# Patient Record
Sex: Male | Born: 1950 | Race: White | Hispanic: No | Marital: Married | Smoking: Former smoker
Health system: Southern US, Community
[De-identification: ages and names within clinical notes are randomized; demographics above are authoritative.]

## PROBLEM LIST (undated history)

## (undated) DIAGNOSIS — G4730 Sleep apnea, unspecified: Secondary | ICD-10-CM

## (undated) DIAGNOSIS — I739 Peripheral vascular disease, unspecified: Secondary | ICD-10-CM

## (undated) DIAGNOSIS — G4700 Insomnia, unspecified: Secondary | ICD-10-CM

## (undated) DIAGNOSIS — I2510 Atherosclerotic heart disease of native coronary artery without angina pectoris: Secondary | ICD-10-CM

## (undated) DIAGNOSIS — I209 Angina pectoris, unspecified: Secondary | ICD-10-CM

## (undated) DIAGNOSIS — N529 Male erectile dysfunction, unspecified: Secondary | ICD-10-CM

## (undated) DIAGNOSIS — I10 Essential (primary) hypertension: Secondary | ICD-10-CM

## (undated) DIAGNOSIS — M545 Low back pain, unspecified: Secondary | ICD-10-CM

## (undated) DIAGNOSIS — D126 Benign neoplasm of colon, unspecified: Secondary | ICD-10-CM

## (undated) DIAGNOSIS — L409 Psoriasis, unspecified: Secondary | ICD-10-CM

## (undated) DIAGNOSIS — F419 Anxiety disorder, unspecified: Secondary | ICD-10-CM

## (undated) DIAGNOSIS — R0602 Shortness of breath: Secondary | ICD-10-CM

## (undated) DIAGNOSIS — C61 Malignant neoplasm of prostate: Secondary | ICD-10-CM

## (undated) DIAGNOSIS — E785 Hyperlipidemia, unspecified: Secondary | ICD-10-CM

## (undated) HISTORY — DX: Shortness of breath: R06.02

## (undated) HISTORY — PX: PROSTATE BIOPSY: SHX241

## (undated) HISTORY — DX: Anxiety disorder, unspecified: F41.9

## (undated) HISTORY — DX: Benign neoplasm of colon, unspecified: D12.6

## (undated) HISTORY — DX: Low back pain, unspecified: M54.50

## (undated) HISTORY — DX: Hyperlipidemia, unspecified: E78.5

## (undated) HISTORY — DX: Peripheral vascular disease, unspecified: I73.9

## (undated) HISTORY — PX: TONSILLECTOMY: SUR1361

## (undated) HISTORY — DX: Psoriasis, unspecified: L40.9

## (undated) HISTORY — DX: Low back pain: M54.5

## (undated) HISTORY — DX: Insomnia, unspecified: G47.00

## (undated) HISTORY — DX: Male erectile dysfunction, unspecified: N52.9

---

## 2009-01-06 ENCOUNTER — Encounter (INDEPENDENT_AMBULATORY_CARE_PROVIDER_SITE_OTHER): Payer: Self-pay | Admitting: Neurology

## 2009-01-06 ENCOUNTER — Ambulatory Visit: Payer: Self-pay

## 2010-08-13 ENCOUNTER — Encounter: Admission: RE | Admit: 2010-08-13 | Discharge: 2010-08-13 | Payer: Self-pay | Admitting: Family Medicine

## 2010-08-19 ENCOUNTER — Encounter
Admission: RE | Admit: 2010-08-19 | Discharge: 2010-08-19 | Payer: Self-pay | Source: Home / Self Care | Attending: Family Medicine | Admitting: Family Medicine

## 2011-03-19 ENCOUNTER — Other Ambulatory Visit: Payer: Self-pay | Admitting: Family Medicine

## 2011-03-19 DIAGNOSIS — I739 Peripheral vascular disease, unspecified: Secondary | ICD-10-CM

## 2011-03-19 DIAGNOSIS — M79606 Pain in leg, unspecified: Secondary | ICD-10-CM

## 2011-03-24 ENCOUNTER — Ambulatory Visit
Admission: RE | Admit: 2011-03-24 | Discharge: 2011-03-24 | Disposition: A | Discharge status: Home / Self Care | Payer: BC Managed Care – PPO | Source: Ambulatory Visit | Attending: Family Medicine | Admitting: Family Medicine

## 2011-03-24 ENCOUNTER — Other Ambulatory Visit: Payer: Self-pay

## 2011-03-24 DIAGNOSIS — M79606 Pain in leg, unspecified: Secondary | ICD-10-CM

## 2011-03-24 DIAGNOSIS — I739 Peripheral vascular disease, unspecified: Secondary | ICD-10-CM

## 2011-03-25 ENCOUNTER — Other Ambulatory Visit (HOSPITAL_COMMUNITY): Payer: Self-pay | Admitting: Family Medicine

## 2011-03-25 DIAGNOSIS — I739 Peripheral vascular disease, unspecified: Secondary | ICD-10-CM

## 2011-03-30 ENCOUNTER — Other Ambulatory Visit (HOSPITAL_COMMUNITY): Payer: Self-pay | Admitting: Family Medicine

## 2011-03-30 ENCOUNTER — Encounter (HOSPITAL_COMMUNITY): Payer: Self-pay

## 2011-03-30 ENCOUNTER — Ambulatory Visit (HOSPITAL_COMMUNITY)
Admission: RE | Admit: 2011-03-30 | Discharge: 2011-03-30 | Disposition: A | Discharge status: Home / Self Care | Payer: BC Managed Care – PPO | Source: Ambulatory Visit | Attending: Family Medicine | Admitting: Family Medicine

## 2011-03-30 DIAGNOSIS — I739 Peripheral vascular disease, unspecified: Secondary | ICD-10-CM

## 2011-03-30 DIAGNOSIS — I743 Embolism and thrombosis of arteries of the lower extremities: Secondary | ICD-10-CM | POA: Insufficient documentation

## 2011-03-30 DIAGNOSIS — I708 Atherosclerosis of other arteries: Secondary | ICD-10-CM | POA: Insufficient documentation

## 2011-03-30 HISTORY — DX: Peripheral vascular disease, unspecified: I73.9

## 2011-03-30 HISTORY — DX: Essential (primary) hypertension: I10

## 2011-03-30 MED ORDER — IOHEXOL 350 MG/ML SOLN: 100.0000 mL | Freq: Once | INTRAVENOUS | Status: AC | PRN

## 2011-04-05 ENCOUNTER — Other Ambulatory Visit (HOSPITAL_COMMUNITY): Payer: Self-pay | Admitting: Endocrinology

## 2011-04-05 DIAGNOSIS — E0590 Thyrotoxicosis, unspecified without thyrotoxic crisis or storm: Secondary | ICD-10-CM

## 2011-04-20 ENCOUNTER — Encounter: Payer: Self-pay | Admitting: Vascular Surgery

## 2011-04-22 ENCOUNTER — Ambulatory Visit (INDEPENDENT_AMBULATORY_CARE_PROVIDER_SITE_OTHER): Payer: BC Managed Care – PPO | Admitting: Vascular Surgery

## 2011-04-22 ENCOUNTER — Encounter: Payer: BC Managed Care – PPO | Admitting: Vascular Surgery

## 2011-04-22 ENCOUNTER — Encounter: Payer: Self-pay | Admitting: Vascular Surgery

## 2011-04-22 VITALS — BP 148/78 | HR 77 | Ht 66.0 in | Wt 191.0 lb

## 2011-04-22 DIAGNOSIS — I739 Peripheral vascular disease, unspecified: Secondary | ICD-10-CM

## 2011-04-22 NOTE — Progress Notes (Signed)
Subjective:     Patient ID: Paul Tyler, male   DOB: March 21, 1951, 60 y.o.   MRN: [id number]  HPI Patient is a 60 year old male who complains of bilateral lower extremity pain. He states that his right leg is the worst. He begins to develop calf pain in the right leg after walking 25-30 feet. He is limited somewhat at work by this. The pain is relieved with rest. He also has occasional pain in the left calf but this is not as bad. The pain has become slowly worse over the last 6 months. He denies rest pain or nonhealing ulcers. Primary atherosclerotic risk factors include hypertension, hyperlipidemia, tobacco abuse. He did quit smoking 4 days ago.  Chronic medical problems including his hypertension and hyperlipidemia are currently controlled and followed by Dr. Shaune Pollack.  Past Medical History  Diagnosis Date  . Hypertension   . PVD (peripheral vascular disease)   . Hyperlipidemia   . Psoriasis   . Recurrent low back pain   . Adenomatous colon polyp   . Peripheral vascular disease     right leg  . SOB (shortness of breath) on exertion   . Anxiety     History   Social History  . Marital Status: Married    Spouse Name: N/A    Number of Children: N/A  . Years of Education: N/A   Occupational History  . Not on file.   Social History Main Topics  . Smoking status: Current Some Day Smoker    Types: Cigarettes    Last Attempt to Quit: 04/18/2011  . Smokeless tobacco: Not on file   Comment: Patient is currently not smoking  . Alcohol Use: 0.0 oz/week    3-4 Cans of beer per week     3-4/day twice a week  . Drug Use: No  . Sexually Active:    Other Topics Concern  . Not on file   Social History Narrative  . No narrative on file    Family History  Problem Relation Age of Onset  . Cancer Father   . Heart disease Father   . Diabetes Father   . Heart disease Mother   . Stroke Mother     On Coumadin  . Diabetes Mother     Borderline, no meds  . Cancer Maternal  Uncle     prostate cancer      Review of Systems  Constitutional: Negative.   HENT: Negative.   Eyes: Negative.   Respiratory: Positive for shortness of breath.   Cardiovascular: Negative.   Gastrointestinal: Negative.   Genitourinary: Negative.   Musculoskeletal: Positive for myalgias.  Neurological: Negative.   Hematological: Negative.   Psychiatric/Behavioral: The patient is nervous/anxious.        Objective:   Physical Exam Blood pressure 148/78, pulse 77, height 5\' 6"  (1.676 m), weight 191 lb (86.637 kg), SpO2 97.00%.  HEEENT: Negative  Chest: Clear to auscultation bilaterally  Cardiac: Regular rate and rhythm without murmur  Abdomen: Soft nontender nondistended no masses, slightly obese  Musculoskeletal no major joint deformities  Neuro: Alert and oriented, symmetric upper extremity and lower Charmaine motor strength  Skin: No ulcers or rashes  Neck: No carotid bruit  Extremities: 2+ radial and femoral pulses bilaterally, absent pedal pulses right foot, 2+ left posterior tibial pulse  CT angiogram: CT dated 03/30/2011 at [facility] was reviewed. The images show some mild right external iliac artery stenosis as well as a right superficial femoral artery occlusion with three-vessel  runoff. In the left lower extremity there is diffuse atherosclerosis but no focal significant stenosis again with three-vessel runoff.  ABI: ABIs and segmental pressures were reviewed from [facility] dated 03/24/2011. ABI right 0.48, left 1.19     Assessment:     Patient with moderately disabling right lower extremity claudication. Possible options of treatment were discussed with the patient today. Option #1 walking program of 30 minutes daily combined with smoking cessation and Pletal 100 mg twice daily. Option #2 lower extremity arteriogram with possible angioplasty and stenting option #3 right lower extremity bypass angioplasty and stenting not possible      Plan:     Patient currently has opted for conservative management with smoking cessation walking program and Pletal twice daily. He will followup with me in 6 months time for repeat ABIs. He will followup sooner if he changes his mind and wishes to pursue an intervention. Short term disability paperwork was also filled out for the patient today.

## 2011-04-27 ENCOUNTER — Other Ambulatory Visit (HOSPITAL_COMMUNITY): Payer: BC Managed Care – PPO

## 2011-04-27 ENCOUNTER — Ambulatory Visit (HOSPITAL_COMMUNITY): Payer: BC Managed Care – PPO

## 2011-04-28 ENCOUNTER — Other Ambulatory Visit (HOSPITAL_COMMUNITY): Payer: BC Managed Care – PPO

## 2011-05-04 ENCOUNTER — Encounter: Payer: BC Managed Care – PPO | Admitting: Vascular Surgery

## 2011-05-18 ENCOUNTER — Ambulatory Visit (HOSPITAL_COMMUNITY): Payer: BC Managed Care – PPO

## 2011-05-18 ENCOUNTER — Other Ambulatory Visit (HOSPITAL_COMMUNITY): Payer: BC Managed Care – PPO

## 2011-05-19 ENCOUNTER — Other Ambulatory Visit (HOSPITAL_COMMUNITY): Payer: BC Managed Care – PPO

## 2011-10-28 ENCOUNTER — Ambulatory Visit: Payer: BC Managed Care – PPO | Admitting: Vascular Surgery

## 2011-11-29 ENCOUNTER — Other Ambulatory Visit: Payer: Self-pay | Admitting: Urology

## 2011-12-15 ENCOUNTER — Ambulatory Visit (HOSPITAL_COMMUNITY)
Admission: RE | Admit: 2011-12-15 | Discharge: 2011-12-15 | Disposition: A | Discharge status: Home / Self Care | Payer: BC Managed Care – PPO | Source: Ambulatory Visit | Attending: Urology | Admitting: Urology

## 2011-12-15 DIAGNOSIS — N281 Cyst of kidney, acquired: Secondary | ICD-10-CM | POA: Insufficient documentation

## 2011-12-15 DIAGNOSIS — K829 Disease of gallbladder, unspecified: Secondary | ICD-10-CM | POA: Insufficient documentation

## 2011-12-15 MED ORDER — GADOBENATE DIMEGLUMINE 529 MG/ML IV SOLN
20.0000 mL | Freq: Once | INTRAVENOUS | Status: AC | PRN
  Administered 2011-12-15: 20 mL via INTRAVENOUS

## 2012-04-11 ENCOUNTER — Other Ambulatory Visit: Payer: Self-pay | Admitting: Interventional Cardiology

## 2012-04-12 ENCOUNTER — Encounter (HOSPITAL_COMMUNITY): Payer: Self-pay | Admitting: Pharmacy Technician

## 2012-04-13 ENCOUNTER — Encounter (HOSPITAL_COMMUNITY): Payer: Self-pay | Admitting: General Practice

## 2012-04-13 ENCOUNTER — Encounter (HOSPITAL_COMMUNITY)
Admission: RE | Disposition: A | Discharge status: Home / Self Care | Payer: Self-pay | Source: Ambulatory Visit | Attending: Interventional Cardiology

## 2012-04-13 ENCOUNTER — Ambulatory Visit (HOSPITAL_COMMUNITY)
Admission: RE | Admit: 2012-04-13 | Discharge: 2012-04-14 | Disposition: A | Discharge status: Home / Self Care | Payer: BC Managed Care – PPO | Source: Ambulatory Visit | Attending: Interventional Cardiology | Admitting: Interventional Cardiology

## 2012-04-13 ENCOUNTER — Encounter (HOSPITAL_BASED_OUTPATIENT_CLINIC_OR_DEPARTMENT_OTHER): Admission: RE | Payer: Self-pay | Source: Ambulatory Visit

## 2012-04-13 ENCOUNTER — Inpatient Hospital Stay (HOSPITAL_BASED_OUTPATIENT_CLINIC_OR_DEPARTMENT_OTHER)
Admission: RE | Admit: 2012-04-13 | Payer: BC Managed Care – PPO | Source: Ambulatory Visit | Admitting: Interventional Cardiology

## 2012-04-13 DIAGNOSIS — I10 Essential (primary) hypertension: Secondary | ICD-10-CM | POA: Insufficient documentation

## 2012-04-13 DIAGNOSIS — I2510 Atherosclerotic heart disease of native coronary artery without angina pectoris: Secondary | ICD-10-CM | POA: Insufficient documentation

## 2012-04-13 DIAGNOSIS — I200 Unstable angina: Secondary | ICD-10-CM | POA: Insufficient documentation

## 2012-04-13 DIAGNOSIS — E785 Hyperlipidemia, unspecified: Secondary | ICD-10-CM | POA: Insufficient documentation

## 2012-04-13 HISTORY — PX: LEFT HEART CATHETERIZATION WITH CORONARY ANGIOGRAM: SHX5451

## 2012-04-13 HISTORY — DX: Atherosclerotic heart disease of native coronary artery without angina pectoris: I25.10

## 2012-04-13 HISTORY — DX: Angina pectoris, unspecified: I20.9

## 2012-04-13 HISTORY — PX: CORONARY ANGIOPLASTY WITH STENT PLACEMENT: SHX49

## 2012-04-13 LAB — POCT ACTIVATED CLOTTING TIME: Activated Clotting Time: 349 seconds

## 2012-04-13 SURGERY — LEFT HEART CATHETERIZATION WITH CORONARY ANGIOGRAM
Anesthesia: LOCAL

## 2012-04-13 SURGERY — JV LEFT HEART CATHETERIZATION WITH CORONARY ANGIOGRAM
Anesthesia: Moderate Sedation

## 2012-04-13 MED ORDER — CLOPIDOGREL BISULFATE 75 MG PO TABS
75.0000 mg | ORAL_TABLET | Freq: Every day | ORAL | Status: DC
  Administered 2012-04-14: 08:00:00 75 mg via ORAL
  Filled 2012-04-13: qty 1

## 2012-04-13 MED ORDER — VERAPAMIL HCL 2.5 MG/ML IV SOLN
INTRAVENOUS | Status: AC
  Filled 2012-04-13: qty 2

## 2012-04-13 MED ORDER — DIAZEPAM 5 MG PO TABS
5.0000 mg | ORAL_TABLET | ORAL | Status: AC
  Administered 2012-04-13: 5 mg via ORAL
  Filled 2012-04-13: qty 1

## 2012-04-13 MED ORDER — MORPHINE SULFATE 2 MG/ML IJ SOLN: 1.0000 mg | INTRAMUSCULAR | Status: DC | PRN

## 2012-04-13 MED ORDER — FENTANYL CITRATE 0.05 MG/ML IJ SOLN
INTRAMUSCULAR | Status: AC
  Filled 2012-04-13: qty 2

## 2012-04-13 MED ORDER — CLOPIDOGREL BISULFATE 300 MG PO TABS
ORAL_TABLET | ORAL | Status: AC
  Filled 2012-04-13: qty 2

## 2012-04-13 MED ORDER — MIDAZOLAM HCL 2 MG/2ML IJ SOLN
INTRAMUSCULAR | Status: AC
  Filled 2012-04-13: qty 2

## 2012-04-13 MED ORDER — ALUM & MAG HYDROXIDE-SIMETH 200-200-20 MG/5ML PO SUSP
ORAL | Status: AC
  Filled 2012-04-13: qty 30

## 2012-04-13 MED ORDER — ASPIRIN 81 MG PO TABS: 162.0000 mg | ORAL_TABLET | Freq: Every day | ORAL | Status: DC

## 2012-04-13 MED ORDER — ONDANSETRON HCL 4 MG/2ML IJ SOLN: 4.0000 mg | Freq: Four times a day (QID) | INTRAMUSCULAR | Status: DC | PRN

## 2012-04-13 MED ORDER — ACETAMINOPHEN 325 MG PO TABS: 650.0000 mg | ORAL_TABLET | ORAL | Status: DC | PRN

## 2012-04-13 MED ORDER — SODIUM CHLORIDE 0.9 % IV SOLN
0.2500 mg/kg/h | INTRAVENOUS | Status: DC
  Filled 2012-04-13: qty 250

## 2012-04-13 MED ORDER — ASPIRIN 81 MG PO CHEW
324.0000 mg | CHEWABLE_TABLET | ORAL | Status: AC
  Administered 2012-04-13: 354 mg via ORAL
  Filled 2012-04-13: qty 4

## 2012-04-13 MED ORDER — HEPARIN SODIUM (PORCINE) 1000 UNIT/ML IJ SOLN
INTRAMUSCULAR | Status: AC
  Filled 2012-04-13: qty 1

## 2012-04-13 MED ORDER — ASPIRIN EC 81 MG PO TBEC
162.0000 mg | DELAYED_RELEASE_TABLET | Freq: Every day | ORAL | Status: DC
  Administered 2012-04-14: 162 mg via ORAL
  Filled 2012-04-13 (×2): qty 2

## 2012-04-13 MED ORDER — NITROGLYCERIN 0.2 MG/ML ON CALL CATH LAB
INTRAVENOUS | Status: AC
  Filled 2012-04-13: qty 1

## 2012-04-13 MED ORDER — SODIUM CHLORIDE 0.9 % IJ SOLN: 3.0000 mL | Freq: Two times a day (BID) | INTRAMUSCULAR | Status: DC

## 2012-04-13 MED ORDER — NITROGLYCERIN 0.4 MG SL SUBL: 0.4000 mg | SUBLINGUAL_TABLET | SUBLINGUAL | Status: DC | PRN

## 2012-04-13 MED ORDER — SODIUM CHLORIDE 0.9 % IV SOLN
1.0000 mL/kg/h | INTRAVENOUS | Status: AC
  Administered 2012-04-13: 1 mL/kg/h via INTRAVENOUS

## 2012-04-13 MED ORDER — SODIUM CHLORIDE 0.9 % IV SOLN
INTRAVENOUS | Status: DC
  Administered 2012-04-13: 75 mL/h via INTRAVENOUS

## 2012-04-13 MED ORDER — ALPRAZOLAM 0.25 MG PO TABS
0.5000 mg | ORAL_TABLET | Freq: Three times a day (TID) | ORAL | Status: DC | PRN
Start: 2012-04-13 — End: 2012-04-14

## 2012-04-13 MED ORDER — HEPARIN (PORCINE) IN NACL 2-0.9 UNIT/ML-% IJ SOLN
INTRAMUSCULAR | Status: AC
  Filled 2012-04-13: qty 2000

## 2012-04-13 MED ORDER — SODIUM CHLORIDE 0.9 % IV SOLN: 250.0000 mL | INTRAVENOUS | Status: DC | PRN

## 2012-04-13 MED ORDER — SODIUM CHLORIDE 0.9 % IJ SOLN: 3.0000 mL | INTRAMUSCULAR | Status: DC | PRN

## 2012-04-13 MED ORDER — ALUM & MAG HYDROXIDE-SIMETH 200-200-20 MG/5ML PO SUSP
30.0000 mL | ORAL | Status: DC | PRN
  Administered 2012-04-13: 30 mL via ORAL
  Filled 2012-04-13: qty 30

## 2012-04-13 MED ORDER — LIDOCAINE HCL (PF) 1 % IJ SOLN
INTRAMUSCULAR | Status: AC
  Filled 2012-04-13: qty 30

## 2012-04-13 MED ORDER — BIVALIRUDIN 250 MG IV SOLR
INTRAVENOUS | Status: AC
  Filled 2012-04-13: qty 250

## 2012-04-13 NOTE — Progress Notes (Signed)
TR BAND REMOVAL  LOCATION:  right radial  DEFLATED PER PROTOCOL:  yes  TIME BAND OFF / DRESSING APPLIED:   1430   SITE UPON ARRIVAL:   Level 0  SITE AFTER BAND REMOVAL:  Level 0  REVERSE ALLEN'S TEST:    positive  CIRCULATION SENSATION AND MOVEMENT:  Within Normal Limits  yes  COMMENTS:     

## 2012-04-13 NOTE — H&P (Signed)
  Date of Initial H&P: 04/11/12  History reviewed, patient examined, no change in status, stable for surgery.  Questions answered.  Discussed with patient and wife before procedure.

## 2012-04-13 NOTE — CV Procedure (Addendum)
PROCEDURE:  Left heart catheterization with selective coronary angiography, left ventriculogram.  PCI LAD  INDICATIONS:  Accelerating angina.  Class III Angina  The risks, benefits, and details of the procedure were explained to the patient.  The patient verbalized understanding and wanted to proceed.  Informed written consent was obtained.  PROCEDURE TECHNIQUE:  After Xylocaine anesthesia a 51F sheath was placed in the right radial artery with a single anterior needle wall stick.   Left coronary angiography was done using a Judkins L4 guide catheter.  Right coronary angiography was done using a Judkins R4 guide catheter.  Left ventriculography was done using a pigtail catheter.  IC NTG to the RCA during the diagnostic.   CONTRAST:  Total of 155 cc.  COMPLICATIONS:  None.    HEMODYNAMICS:  Aortic pressure was 125/66; LV pressure was 125/7; LVEDP 10.  There was no gradient between the left ventricle and aorta.    ANGIOGRAPHIC DATA:   The left main coronary artery is a short vessel but widely patent.  The left anterior descending artery is a large vessel.  In the mid portion, there is a 95% stenosis before a large first diagonal.  Just past this focal stenosis, there is moderate disease.  There is an area of spontaneous intimal disruption.  There is mild diffuse disease in the remainder of the LAD.  The left circumflex artery is a large vessel.  There is a large OM1.  There are mild irregularities in the circumflex system.  The right coronary artery is a medium sized vessel.  There is moderate diffuse disease in the entire vessel.  There is a 50-60% stenosis at the bend in the distal vessel.  LEFT VENTRICULOGRAM:  Left ventricular angiogram was done in the 30 RAO projection and revealed normal left ventricular wall motion and systolic function with an estimated ejection fraction of 55%.  LVEDP was 10 mmHg.  PCI Narrative:  Angiomax was used for anticoagulation.  ACT was performed.  A CLS 3.0  Guide catheter was used to engage the left main.  A prowater wire was used to cross the area of disease in the LAD.  A 2.5 x 12 predilatation balloon was inflated twice.  There was moderate disease past the focal area of severe stenosis.  There is an irregularity of the may have been the beginnings of a spontaneous dissection even further down.  We elected to try and cover this entire diseased segment with a single drug-eluting stent.  A 2.75 x 32 Promus drug-eluting stent was deployed to 16 atmospheres.  The proximal and mid stented portion was postdilated with a 3.5 x 20 noncompliant balloon.  There is an excellent graft result.  There was a diagonal that was jailed but there is no significant stenosis at the ostium and flow was normal.  The patient tolerated the procedure well.  Several doses of intracoronary nitroglycerin were administered to help treat vasospasm at the distal edge of the stented area.  IMPRESSIONS:  1. Normal left main coronary artery. 2. 95% mid  left anterior descending artery stenosis with moderate disease just beyond this area.  This was successfully treated with a drug eluting stent. 3. Mild disease in the  left circumflex artery and its branches. 4. Moderate diffuse disease in a medium sized right coronary artery. 5. Normal left ventricular systolic function.  LVEDP 10 mmHg.  Ejection fraction 55%.  RECOMMENDATION:  Aspirin and plavix for at least a year.  Aggressive secondary prevention.  The patient we watched  overnight.  If there no complications, anticipate discharge tomorrow.  We have spoken about weight loss and how to better manage his LDL.  He has been intolerant of several statins.

## 2012-04-14 LAB — BASIC METABOLIC PANEL
BUN: 17 mg/dL (ref 6–23)
GFR calc Af Amer: >90 mL/min (ref >90)
GFR calc non Af Amer: >90 mL/min (ref >90)
Potassium: 4.1 mEq/L (ref 3.5–5.1)
Sodium: 140 mEq/L (ref 135–145)

## 2012-04-14 LAB — CBC
HCT: 40 % (ref 39.0–52.0)
MCHC: 34.3 g/dL (ref 30.0–36.0)
Platelets: 185 10*3/uL (ref 150–400)
RDW: 12.5 % (ref 11.5–15.5)
WBC: 8.6 10*3/uL (ref 4.0–10.5)

## 2012-04-14 MED ORDER — CLOPIDOGREL BISULFATE 75 MG PO TABS: 75.0000 mg | ORAL_TABLET | Freq: Every day | ORAL | Status: AC

## 2012-04-14 MED ORDER — ASPIRIN 81 MG PO TABS: 162.0000 mg | ORAL_TABLET | Freq: Every day | ORAL | Status: DC

## 2012-04-14 MED ORDER — NON FORMULARY: 10.0000 mg | Status: DC

## 2012-04-14 MED ORDER — ROSUVASTATIN CALCIUM 10 MG PO TABS
10.0000 mg | ORAL_TABLET | ORAL | Status: DC
  Filled 2012-04-14: qty 1

## 2012-04-14 MED ORDER — LISINOPRIL 10 MG PO TABS: 10.0000 mg | ORAL_TABLET | Freq: Every day | ORAL | Status: DC

## 2012-04-14 MED ORDER — ROSUVASTATIN CALCIUM 10 MG PO TABS: 10.0000 mg | ORAL_TABLET | ORAL | Status: DC

## 2012-04-14 MED ORDER — LISINOPRIL 10 MG PO TABS
10.0000 mg | ORAL_TABLET | Freq: Every day | ORAL | Status: DC
  Administered 2012-04-14: 10 mg via ORAL
  Filled 2012-04-14: qty 1

## 2012-04-14 MED FILL — Dextrose Inj 5%: INTRAVENOUS | Qty: 50 | Status: AC

## 2012-04-14 NOTE — Discharge Summary (Signed)
Patient ID: Paul Tyler MRN: [id number] DOB/AGE: 1951/08/24 61 y.o.  Admit date: 04/13/2012 Discharge date: 04/14/2012  Primary Discharge Diagnosis Unstable angina Secondary Discharge Diagnosis HTN, hyperlipidemia, CAD  Significant Diagnostic Studies: angiography: cardiac cath- drug eluting stent to the LAD. 2.75 x 32 Promus.  Consults: None  Hospital Course: 61 y/o who was seen in the office for class 3 angina, which was accelerating.  He underwent cath showing a 95% LAD lesion, with more moderate disease distally.  This was treated with a 2.75 x 32 mm Promus.  He tolerated the procedure well.  No bleeding from the radial site.  No angina with walking.  He is willing to try Crestor 10 mg weekly.  We will give him samples from our office.  We'll also consider checking vitamin D level if this is not ordered been done.  Perhaps vitamin D supplementation may make tolerating a statin easier.  Could also consider using coenzyme Q 10.  His blood pressure was increased during his hospital stay.  He has checked his blood pressure at home and typically it runs from 138 -150 systolic.  This is above target for him.  We'll start lisinopril 10 mg daily.  Will check potassium and kidney function within a week.  He was notified that he could develop a cough from this medicine.  He'll let us know if he has any problems.  He does not want to stay on medications if he does not have to.  We had a lengthy discussion about this.  If he loses weight and his blood pressure comes down, we could certainly cutback on his blood pressure medication.  He'll need to also increase his exercise.  We spoke about cardiac rehabilitation.  He says his work schedule would not allow this.  He is willing to try to exercise more at a gym where he is a member.   Discharge Exam: Blood pressure 163/79, pulse 57, temperature 98 F (36.7 C), temperature source Oral, resp. rate 12, height 5\' 6"  (1.676 m), weight 91.1 kg (200 lb 13.4 oz),  SpO2 97.00%.  [place]/AT RRR, S1-S2 No wheezing Obese 3+ right radial pulse, no hematoma No lower extremity edema Labs:   Lab Results  Component Value Date   WBC 8.6 04/14/2012   HGB 13.7 04/14/2012   HCT 40.0 04/14/2012   MCV 88.3 04/14/2012   PLT 185 04/14/2012    Lab 04/14/12 0450  NA 140  K 4.1  CL 104  CO2 28  BUN 17  CREATININE 0.72  CALCIUM 8.6  PROT --  BILITOT --  ALKPHOS --  ALT --  AST --  GLUCOSE 115*   No results found for this basename: CKTOTAL, CKMB, CKMBINDEX, TROPONINI    No results found for this basename: CHOL   No results found for this basename: HDL   No results found for this basename: LDLCALC   No results found for this basename: TRIG   No results found for this basename: CHOLHDL   No results found for this basename: LDLDIRECT      Radiology: None EKG: Normal sinus rhythm, nonspecific T-wave changes in the anterior precordium.  FOLLOW UP PLANS AND APPOINTMENTS The patient can return to work on Monday, 04/17/2012.  Medication List  As of 04/14/2012 10:20 AM   TAKE these medications         ALPRAZolam 1 MG tablet   Commonly known as: XANAX   Take 0.5-1 mg by mouth 3 (three) times daily as needed. For anxiety  aspirin 81 MG tablet   Take 2 tablets (162 mg total) by mouth daily.      clopidogrel 75 MG tablet   Commonly known as: PLAVIX   Take 1 tablet (75 mg total) by mouth daily with breakfast.      lisinopril 10 MG tablet   Commonly known as: PRINIVIL,ZESTRIL   Take 1 tablet (10 mg total) by mouth daily.      nitroGLYCERIN 0.4 MG SL tablet   Commonly known as: NITROSTAT   Place 0.4 mg under the tongue every 5 (five) minutes as needed. For chest pain      sildenafil 50 MG tablet   Commonly known as: VIAGRA   Take 50 mg by mouth daily as needed. For erectile dysfunction           Follow-up Information    Follow up with Paul Tyler., MD on 05/02/2012. (4:00 PM)    Contact information:   [address] [place] [address] [phone]       Follow up with Paul Tyler., MD on 04/20/2012. (lab work-anytime-nonfasting)    Contact information:   [address] [place] [address] [phone]          BRING ALL MEDICATIONS WITH YOU TO FOLLOW UP APPOINTMENTS  Time spent with patient to include physician time: 35 minutes going over the patient's questions regarding all of his new medications and long term plan for risk factor modification. SignedLance Muss S. 04/14/2012, 10:20 AM

## 2012-04-14 NOTE — Progress Notes (Signed)
CARDIAC REHAB PHASE I   PRE:  Rate/Rhythm: 60SR  BP:  Supine:   Sitting: 137/72  Standing:    SaO2:   MODE:  Ambulation: 700 ft   POST:  Rate/Rhythem: 69SR  BP:  Supine:   Sitting: 163/79  Standing:    SaO2:  0800-0900 Pt walked 700 ft with steady gait. Denied chest pain. Tolerated well. Education completed. Declined CRP 2 due to work schedule. Will ex on his own. Stressed importance of taking plavix and aspirin daily. Pt states does not like to take meds but will take these.  Paul Tyler

## 2012-11-09 ENCOUNTER — Other Ambulatory Visit (HOSPITAL_COMMUNITY): Payer: Self-pay | Admitting: Urology

## 2012-11-09 DIAGNOSIS — D49519 Neoplasm of unspecified behavior of unspecified kidney: Secondary | ICD-10-CM

## 2012-12-21 ENCOUNTER — Ambulatory Visit (HOSPITAL_COMMUNITY)
Admission: RE | Admit: 2012-12-21 | Discharge: 2012-12-21 | Disposition: A | Discharge status: Home / Self Care | Payer: BC Managed Care – PPO | Source: Ambulatory Visit | Attending: Urology | Admitting: Urology

## 2012-12-21 DIAGNOSIS — K824 Cholesterolosis of gallbladder: Secondary | ICD-10-CM | POA: Insufficient documentation

## 2012-12-21 DIAGNOSIS — I700 Atherosclerosis of aorta: Secondary | ICD-10-CM | POA: Insufficient documentation

## 2012-12-21 DIAGNOSIS — D49519 Neoplasm of unspecified behavior of unspecified kidney: Secondary | ICD-10-CM

## 2012-12-21 DIAGNOSIS — N281 Cyst of kidney, acquired: Secondary | ICD-10-CM | POA: Insufficient documentation

## 2012-12-21 LAB — CREATININE, SERUM
Creatinine, Ser: 0.83 mg/dL (ref 0.50–1.35)
GFR calc non Af Amer: >90 mL/min (ref >90)

## 2012-12-21 MED ORDER — GADOBENATE DIMEGLUMINE 529 MG/ML IV SOLN
17.0000 mL | Freq: Once | INTRAVENOUS | Status: AC | PRN
  Administered 2012-12-21: 17 mL via INTRAVENOUS

## 2013-06-09 ENCOUNTER — Other Ambulatory Visit: Payer: Self-pay | Admitting: Interventional Cardiology

## 2013-06-09 DIAGNOSIS — E782 Mixed hyperlipidemia: Secondary | ICD-10-CM

## 2013-06-09 DIAGNOSIS — Z79899 Other long term (current) drug therapy: Secondary | ICD-10-CM

## 2013-06-18 ENCOUNTER — Telehealth: Payer: Self-pay | Admitting: Interventional Cardiology

## 2013-06-18 NOTE — Telephone Encounter (Signed)
New problem     Discuss cholesterol medication

## 2013-06-19 MED ORDER — ROSUVASTATIN CALCIUM 10 MG PO TABS: ORAL_TABLET | ORAL | Status: DC

## 2013-06-19 NOTE — Telephone Encounter (Signed)
Rx for crestor sent in and pt is requesting samples of crestor until he received mail order.

## 2013-06-19 NOTE — Telephone Encounter (Signed)
Samples of Crestor 20mg  #7 given and pt notified to take 1/2 tab three times a week. Pt will keep cholesterol appt for the end of the month.

## 2013-06-28 ENCOUNTER — Telehealth: Payer: Self-pay | Admitting: Cardiology

## 2013-06-28 NOTE — Telephone Encounter (Signed)
Patient has failed both simvastatin and atorvastatin in the past, and Crestor is the only thing he can tolerate.  If they need [place] to complete a prior authorization, have them fax it to me at [phone] (Attn:  Riki Rusk).  He is taking Crestor 10 mg three times per week.  Okay to give him samples for the time being.

## 2013-06-28 NOTE — Telephone Encounter (Signed)
Pts crestor is not covered by insurance. [facility] requests that we switch pt to simvastatin or atorvastatin. To Riki Rusk, please advise.  Call back # [phone]. Ref # [id number]

## 2013-06-28 NOTE — Telephone Encounter (Signed)
Called [facility] and they will fax over PA form.

## 2013-06-29 NOTE — Telephone Encounter (Signed)
PA form will be signed and faxed today.

## 2013-07-02 ENCOUNTER — Telehealth: Payer: Self-pay | Admitting: Pharmacist

## 2013-07-02 NOTE — Telephone Encounter (Signed)
Patient is awaiting approval for his Crestor prior authorization. Patient requesting samples today until medication approved.  On Crestor 10 mg tiw.  Left Crestor 10 mg samples #14 up front for patient.  Patient notified.

## 2013-07-02 NOTE — Telephone Encounter (Signed)
Message copied by Lou Miner on Mon Jul 02, 2013 11:46 AM ------      Message from: Carmela Hurt      Created: Mon Jul 02, 2013  8:28 AM      Regarding: express number obtained by patient       Please call [phone]      extention [phone]      "Amor"            PLEASE CALL PATIENT ------

## 2013-07-04 ENCOUNTER — Telehealth: Payer: Self-pay | Admitting: Interventional Cardiology

## 2013-07-04 NOTE — Telephone Encounter (Signed)
Follow up     Pt canceled lab and is bringing in results from work and will pick up samples of  Crestor.

## 2013-07-05 ENCOUNTER — Other Ambulatory Visit: Payer: Self-pay | Admitting: Interventional Cardiology

## 2013-07-09 ENCOUNTER — Other Ambulatory Visit: Payer: BC Managed Care – PPO

## 2013-12-09 ENCOUNTER — Other Ambulatory Visit: Payer: Self-pay | Admitting: Interventional Cardiology

## 2014-01-29 ENCOUNTER — Ambulatory Visit (INDEPENDENT_AMBULATORY_CARE_PROVIDER_SITE_OTHER): Payer: BC Managed Care – PPO | Admitting: Interventional Cardiology

## 2014-01-29 ENCOUNTER — Encounter: Payer: Self-pay | Admitting: Interventional Cardiology

## 2014-01-29 VITALS — BP 154/71 | HR 81 | Wt 181.0 lb

## 2014-01-29 DIAGNOSIS — I10 Essential (primary) hypertension: Secondary | ICD-10-CM

## 2014-01-29 DIAGNOSIS — I2510 Atherosclerotic heart disease of native coronary artery without angina pectoris: Secondary | ICD-10-CM

## 2014-01-29 DIAGNOSIS — E782 Mixed hyperlipidemia: Secondary | ICD-10-CM

## 2014-01-29 MED ORDER — ROSUVASTATIN CALCIUM 10 MG PO TABS: ORAL_TABLET | ORAL | Status: DC

## 2014-01-29 NOTE — Progress Notes (Signed)
Patient ID: Paul Tyler, male   DOB: [birth year], 62 y.o.   MRN: [id number]    [place], [place], [place]  [address] Phone: [phone] Fax:  [phone]  Date:  01/29/2014   ID:  Paul Tyler, Paul Tyler [birth year], MRN [id number]  PCP:  Marjorie Smolder, MD      History of Present Illness: Paul Tyler is a 63 y.o. male who had an LAD stent done in 8/13. No more dizzy spells.  BP has been well controled at home (130-140/85). No CP or NTG use.  No SHOB.  Tolerating Crestor.  Does a lot of yard work.  No dedicated walking.      Wt Readings from Last 3 Encounters:  01/29/14 181 lb (82.101 kg)  04/14/12 200 lb 13.4 oz (91.1 kg)  04/14/12 200 lb 13.4 oz (91.1 kg)     Past Medical History  Diagnosis Date  . Hypertension   . PVD (peripheral vascular disease)   . Hyperlipidemia   . Psoriasis   . Recurrent low back pain   . Adenomatous colon polyp   . Peripheral vascular disease     right leg  . SOB (shortness of breath) on exertion   . Anxiety   . Coronary artery disease   . Anginal pain     Current Outpatient Prescriptions  Medication Sig Dispense Refill  . ALPRAZolam (XANAX) 1 MG tablet Take 0.5-1 mg by mouth 3 (three) times daily as needed. For anxiety      . aspirin 81 MG tablet Take 81 mg by mouth daily.      . clopidogrel (PLAVIX) 75 MG tablet TAKE 1 TABLET ONCE A DAY  90 tablet  2  . CRESTOR 10 MG tablet TAKE 1 TABLET THREE TIMES A WEEK  12 tablet  0  . lisinopril (PRINIVIL,ZESTRIL) 10 MG tablet Take 1 tablet (10 mg total) by mouth daily.  30 tablet  11  . losartan (COZAAR) 50 MG tablet TAKE 1 TABLET ONCE A DAY  90 tablet  2  . nitroGLYCERIN (NITROSTAT) 0.4 MG SL tablet Place 0.4 mg under the tongue every 5 (five) minutes as needed. For chest pain      . sildenafil (VIAGRA) 50 MG tablet Take 50 mg by mouth daily as needed. For erectile dysfunction       No current facility-administered medications for this visit.    Allergies:    Allergies   Allergen Reactions  . Lipitor [Atorvastatin Calcium]     Causes dizziness  . Zocor [Simvastatin]     Causes muscle pain  . Zyban [Bupropion Hcl]     Causes joint pain    Social History:  The patient  reports that he quit smoking about 2 years ago. His smoking use included Cigarettes. He smoked 0.00 packs per day. He has never used smokeless tobacco. He reports that he drinks alcohol. He reports that he does not use illicit drugs.   Family History:  The patient's family history includes Cancer in his father and maternal uncle; Diabetes in his father and mother; Heart disease in his father and mother; Stroke in his mother.   ROS:  Please see the history of present illness.  No nausea, vomiting.  No fevers, chills.  No focal weakness.  No dysuria.   All other systems reviewed and negative.   PHYSICAL EXAM: VS:  BP 154/71  Pulse 81  Wt 181 lb (82.101 kg) Well nourished, well developed, in no acute distress  HEENT: normal Neck: no JVD, no carotid bruits Cardiac:  normal S1, S2; RRR;  Lungs:  clear to auscultation bilaterally, no wheezing, rhonchi or rales Abd: soft, nontender, no hepatomegaly Ext: no edema Skin: warm and dry Neuro:   no focal abnormalities noted  EKG:  NSR, IRBBB, No St segment changes     ASSESSMENT AND PLAN:  Coronary atherosclerosis of native coronary artery  Continue Plavix Tablet, 75 MG, 1 tablet, Orally, Once a day, 90, Refills 3 Continue Aspirin Adult Low Strength Tablet Delayed Release, 81 MG, 1 tablet, Orally, everyday Continue Nitroglycerin 0.4 mg tablet, 0.4 mg, 1 tablet as directed, SL, as directed prn chest pain Notes: No angina since stent.    2. Mixed hyperlipidemia  Continue Crestor Tablet, 10 MG, 1 tablet, Orally, three a week Notes: LDL much better on Crestor three times weekly. LDL 91 in 7/14.    3. HTN  Started Losartan Potassium Tablet, 50 MG, 1 tablet, Orally, Once a day, 30 day(s), 90, Refills 3 Notes: Controlled at home   4.  Atherosclerosis of native arteries of the extremities with intermittent claudication  Notes: known Right SFA disease. Claudication resolved after stopping smoking. Encouraged walking.    Preventive Medicine  Adult topics discussed:  Diet: healthy diet.  Exercise: 5 days a week, at least 30 minutes of aerobic exercise. Does yard work and walks a lot at work.       Signed, Mina Marble, MD, [facility] 01/29/2014 4:23 PM

## 2014-01-29 NOTE — Patient Instructions (Signed)
Your physician recommends that you continue on your current medications as directed. Please refer to the Current Medication list given to you today.  Your physician wants you to follow-up in: 1 year with Dr. Varanasi. You will receive a reminder letter in the mail two months in advance. If you don't receive a letter, please call our office to schedule the follow-up appointment.  

## 2014-01-31 ENCOUNTER — Other Ambulatory Visit: Payer: Self-pay | Admitting: *Deleted

## 2014-01-31 MED ORDER — ROSUVASTATIN CALCIUM 10 MG PO TABS: ORAL_TABLET | ORAL | Status: DC

## 2014-02-28 ENCOUNTER — Encounter: Payer: Self-pay | Admitting: *Deleted

## 2014-03-08 ENCOUNTER — Other Ambulatory Visit: Payer: Self-pay | Admitting: Interventional Cardiology

## 2014-03-22 ENCOUNTER — Encounter: Payer: Self-pay | Admitting: Interventional Cardiology

## 2014-07-30 ENCOUNTER — Other Ambulatory Visit: Payer: Self-pay | Admitting: Interventional Cardiology

## 2014-08-22 ENCOUNTER — Encounter (HOSPITAL_COMMUNITY): Payer: Self-pay | Admitting: Interventional Cardiology

## 2015-01-21 ENCOUNTER — Other Ambulatory Visit: Payer: Self-pay | Admitting: Interventional Cardiology

## 2015-04-21 ENCOUNTER — Other Ambulatory Visit: Payer: Self-pay | Admitting: Interventional Cardiology

## 2015-04-24 ENCOUNTER — Other Ambulatory Visit: Payer: Self-pay | Admitting: Interventional Cardiology

## 2015-06-20 ENCOUNTER — Encounter: Payer: Self-pay | Admitting: Interventional Cardiology

## 2015-07-19 ENCOUNTER — Other Ambulatory Visit: Payer: Self-pay | Admitting: Interventional Cardiology

## 2015-07-25 ENCOUNTER — Other Ambulatory Visit: Payer: Self-pay | Admitting: Interventional Cardiology

## 2015-08-14 ENCOUNTER — Encounter: Payer: Self-pay | Admitting: Interventional Cardiology

## 2015-08-14 ENCOUNTER — Ambulatory Visit (INDEPENDENT_AMBULATORY_CARE_PROVIDER_SITE_OTHER): Payer: BLUE CROSS/BLUE SHIELD | Admitting: Interventional Cardiology

## 2015-08-14 VITALS — BP 120/64 | HR 59 | Ht 66.0 in | Wt 192.0 lb

## 2015-08-14 DIAGNOSIS — I10 Essential (primary) hypertension: Secondary | ICD-10-CM

## 2015-08-14 DIAGNOSIS — I739 Peripheral vascular disease, unspecified: Secondary | ICD-10-CM

## 2015-08-14 DIAGNOSIS — I2510 Atherosclerotic heart disease of native coronary artery without angina pectoris: Secondary | ICD-10-CM

## 2015-08-14 DIAGNOSIS — E782 Mixed hyperlipidemia: Secondary | ICD-10-CM

## 2015-08-14 NOTE — Progress Notes (Signed)
Patient ID: Paul Tyler, male   DOB: [birth year], 64 y.o.   MRN: [id number]     Cardiology Office Note   Date:  08/14/2015   ID:  Paul Tyler, DOB 01-Jun-1951, MRN [id number]  PCP:  Marjorie Smolder, MD    No chief complaint on file.    Wt Readings from Last 3 Encounters:  08/14/15 192 lb (87.091 kg)  01/29/14 181 lb (82.101 kg)  04/14/12 200 lb 13.4 oz (91.1 kg)       History of Present Illness: Paul Tyler is a 64 y.o. male   who had an LAD stent done in 8/13. No more dizzy spells. BP had been well controled at home (130-140/85), but he has not been checking. No CP or NTG use. No SHOB. Tolerating Crestor. Does a lot of yard work. No dedicated walking. Does walk a lot at work.  No chest pain with that activity.    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  There eis a lot of yard work; weed-eating etc...    Past Medical History  Diagnosis Date  . Hypertension   . PVD (peripheral vascular disease) ([place])   . Hyperlipidemia   . Psoriasis   . Recurrent low back pain   . Adenomatous colon polyp   . Peripheral vascular disease (HCC)     right leg  . SOB (shortness of breath) on exertion   . Anxiety   . Coronary artery disease   . Anginal pain ([place])   . Insomnia, unspecified   . Impotence of organic origin     Past Surgical History  Procedure Laterality Date  . Coronary angioplasty with stent placement  04/13/2012    MID LAD  . Left heart catheterization with coronary angiogram N/A 04/13/2012    Procedure: LEFT HEART CATHETERIZATION WITH CORONARY ANGIOGRAM;  Surgeon: Jettie Booze, MD;  Location: [facility] CATH LAB;  Service: Cardiovascular;  Laterality: N/A;     Current Outpatient Prescriptions  Medication Sig Dispense Refill  . ALPRAZolam (XANAX) 0.25 MG tablet Take 0.25 mg by mouth 2 (two) times daily as needed for anxiety.    Marland Kitchen aspirin 81 MG tablet Take 81 mg by mouth daily.    . Cholecalciferol (VITAMIN D) 2000 UNITS CAPS Take by mouth.      . clopidogrel (PLAVIX) 75 MG tablet TAKE 1 TABLET DAILY (NEEDS TO CONTACT OFFICE TO SCHEDULE APPOINTMENT FOR FUTURE REFILLS [phone]) 30 tablet 0  . losartan (COZAAR) 50 MG tablet Take 1 tablet (50 mg total) by mouth daily. 30 tablet 0  . rosuvastatin (CRESTOR) 10 MG tablet Take 1 tablet (10 mg total) by mouth 3 (three) times a week. 15 tablet 0  . nitroGLYCERIN (NITROSTAT) 0.4 MG SL tablet Place 0.4 mg under the tongue every 5 (five) minutes as needed. For chest pain     No current facility-administered medications for this visit.    Allergies:   Lipitor; Zocor; and Zyban    Social History:  The patient  reports that he quit smoking about 4 years ago. His smoking use included Cigarettes. He has never used smokeless tobacco. He reports that he drinks alcohol. He reports that he does not use illicit drugs.   Family History:  The patient's family history includes Diabetes in his father and mother; Heart disease in his father and mother; Prostate cancer in his father and maternal uncle; Stroke in his mother. There is no history of Heart attack or Hypertension.    ROS:  Please see the history of present illness.   Otherwise, review of systems are positive for easy bruising.  No bleeding.   All other systems are reviewed and negative.    PHYSICAL EXAM: VS:  BP 120/64 mmHg  Pulse 59  Ht 5\' 6"  (1.676 m)  Wt 192 lb (87.091 kg)  BMI 31.00 kg/m2 , BMI Body mass index is 31 kg/(m^2). GEN: Well nourished, well developed, in no acute distress HEENT: normal Neck: no JVD, carotid bruits, or masses Cardiac: RRR; no murmurs, rubs, or gallops,no edema  Respiratory:  clear to auscultation bilaterally, normal work of breathing GI: soft, nontender, nondistended, + BS MS: no deformity or atrophy Skin: warm and dry, no rash Neuro:  Strength and sensation are intact Psych: euthymic mood, full affect   EKG:   The ekg ordered today demonstrates SB, nonspecific ST changes   Recent Labs: No  results found for requested labs within last 365 days.   Lipid Panel No results found for: CHOL, TRIG, HDL, CHOLHDL, VLDL, LDLCALC, LDLDIRECT   Other studies Reviewed: Additional studies/ records that were reviewed today with results demonstrating: Cath results reviewed; 2.75 x 32 Promus to the LAD.     ASSESSMENT AND PLAN:  1. CAD: Stop aspirin, given easy bruising.  Continue clopidogrel.  Continue Nitroglycerin 0.4 mg tablet, 0.4 mg, 1 tablet as directed, SL, as directed prn chest pain. No angina since stent.  2. Mixed hyperlipidemia: Tolerating Crestor 3x/week. Dr. Inda Merlin checked his labs and lipids were well controlled on the most recent check with Dr. Inda Merlin. 3. HTN: COntinue losartan.  Check BP at home.  Let us know if BP over [id number] systolic consistently.  We can adjust medications.  4. PAD: known Right SFA disease. Claudication improved after stopping smoking. Encouraged walking. Very rare right leg pain, after walking a few flights of stairs.  This does not limit his activity.  No non healing sores on his feet.   Current medicines are reviewed at length with the patient today.  The patient concerns regarding his medicines were addressed.  The following changes have been made:  No change  Labs/ tests ordered today include:  No orders of the defined types were placed in this encounter.    Recommend 150 minutes/week of aerobic exercise Low fat, low carb, high fiber diet recommended  Disposition:   FU in  1 year   Teresita Madura., MD  08/14/2015 3:40 PM    [facility] [place], [place], [place]  [address] Phone: [phone]; Fax: [phone]

## 2015-08-14 NOTE — Patient Instructions (Signed)
**Note De-identified Shakeila Pfarr Obfuscation** Medication Instructions:  Same-no changes  Labwork: None  Testing/Procedures: None  Follow-Up: Your physician wants you to follow-up in: 1 year. You will receive a reminder letter in the mail two months in advance. If you don't receive a letter, please call our office to schedule the follow-up appointment.      

## 2015-09-04 ENCOUNTER — Other Ambulatory Visit: Payer: Self-pay | Admitting: *Deleted

## 2015-09-04 MED ORDER — CLOPIDOGREL BISULFATE 75 MG PO TABS: ORAL_TABLET | ORAL | Status: DC

## 2015-09-04 MED ORDER — LOSARTAN POTASSIUM 50 MG PO TABS: 50.0000 mg | ORAL_TABLET | Freq: Every day | ORAL | Status: DC

## 2015-10-22 ENCOUNTER — Other Ambulatory Visit: Payer: Self-pay | Admitting: Interventional Cardiology

## 2015-10-23 ENCOUNTER — Other Ambulatory Visit: Payer: Self-pay | Admitting: *Deleted

## 2015-10-23 MED ORDER — ROSUVASTATIN CALCIUM 10 MG PO TABS: 10.0000 mg | ORAL_TABLET | ORAL | Status: DC

## 2015-10-23 NOTE — Addendum Note (Signed)
Addended by: Domenica Reamer R on: 10/23/2015 02:25 PM   Modules accepted: Orders

## 2016-03-09 ENCOUNTER — Encounter: Payer: Self-pay | Admitting: Interventional Cardiology

## 2016-07-22 ENCOUNTER — Encounter: Payer: Self-pay | Admitting: Interventional Cardiology

## 2016-08-10 ENCOUNTER — Encounter: Payer: Self-pay | Admitting: Interventional Cardiology

## 2016-08-17 ENCOUNTER — Ambulatory Visit (INDEPENDENT_AMBULATORY_CARE_PROVIDER_SITE_OTHER): Payer: BLUE CROSS/BLUE SHIELD | Admitting: Interventional Cardiology

## 2016-08-17 ENCOUNTER — Encounter: Payer: Self-pay | Admitting: Interventional Cardiology

## 2016-08-17 VITALS — BP 130/78 | HR 55 | Ht 65.0 in | Wt 197.0 lb

## 2016-08-17 DIAGNOSIS — I739 Peripheral vascular disease, unspecified: Secondary | ICD-10-CM

## 2016-08-17 DIAGNOSIS — E782 Mixed hyperlipidemia: Secondary | ICD-10-CM

## 2016-08-17 DIAGNOSIS — I10 Essential (primary) hypertension: Secondary | ICD-10-CM | POA: Diagnosis not present

## 2016-08-17 DIAGNOSIS — I2510 Atherosclerotic heart disease of native coronary artery without angina pectoris: Secondary | ICD-10-CM

## 2016-08-17 MED ORDER — CLOPIDOGREL BISULFATE 75 MG PO TABS: ORAL_TABLET | ORAL | 3 refills | Status: DC

## 2016-08-17 MED ORDER — ROSUVASTATIN CALCIUM 10 MG PO TABS: 10.0000 mg | ORAL_TABLET | ORAL | 3 refills | Status: DC

## 2016-08-17 MED ORDER — LOSARTAN POTASSIUM 50 MG PO TABS: 50.0000 mg | ORAL_TABLET | Freq: Every day | ORAL | 3 refills | Status: DC

## 2016-08-17 NOTE — Patient Instructions (Signed)

## 2016-08-17 NOTE — Progress Notes (Signed)
Patient ID: Paul Tyler, male   DOB: [birth year], 65 y.o.   MRN: [id number]     Cardiology Office Note   Date:  08/17/2016   ID:  Paul Tyler, DOB 13-Dec-1950, MRN [id number]  PCP:  Marjorie Smolder, MD    No chief complaint on file. CAD   Wt Readings from Last 3 Encounters:  08/17/16 197 lb (89.4 kg)  08/14/15 192 lb (87.1 kg)  01/29/14 181 lb (82.1 kg)       History of Present Illness: Paul Tyler is a 65 y.o. male   who had an LAD stent done in 8/13, 2.75 x 32 Promus DES, postdilated to 3.5 mm. No more dizzy spells. BP had been well controled at home (130-140/85), but he has not been checking. No CP or NTG use. No SHOB. Tolerating Crestor. Does a lot of yard work. No dedicated walking. Does walk a lot at work.  No chest pain with that activity.    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  No regular exercise.  He does a lot of yard work.    Trying to eat healthier.   Easy bleeding from scratches.   Past Medical History:  Diagnosis Date  . Adenomatous colon polyp   . Anginal pain ([place])   . Anxiety   . Coronary artery disease   . Hyperlipidemia   . Hypertension   . Impotence of organic origin   . Insomnia, unspecified   . Peripheral vascular disease (HCC)    right leg  . Psoriasis   . PVD (peripheral vascular disease) ([place])   . Recurrent low back pain   . SOB (shortness of breath) on exertion     Past Surgical History:  Procedure Laterality Date  . CORONARY ANGIOPLASTY WITH STENT PLACEMENT  04/13/2012   MID LAD  . LEFT HEART CATHETERIZATION WITH CORONARY ANGIOGRAM N/A 04/13/2012   Procedure: LEFT HEART CATHETERIZATION WITH CORONARY ANGIOGRAM;  Surgeon: Jettie Booze, MD;  Location: [facility] CATH LAB;  Service: Cardiovascular;  Laterality: N/A;     Current Outpatient Prescriptions  Medication Sig Dispense Refill  . ALPRAZolam (XANAX) 0.25 MG tablet Take 0.25 mg by mouth 2 (two) times  daily as needed for anxiety.    Marland Kitchen aspirin 81 MG tablet Take 81 mg by mouth daily.    . Cholecalciferol (VITAMIN D) 2000 UNITS CAPS Take by mouth.    . clopidogrel (PLAVIX) 75 MG tablet TAKE 1 TABLET DAILY 14 tablet 0  . losartan (COZAAR) 50 MG tablet Take 1 tablet (50 mg total) by mouth daily. 90 tablet 3  . nitroGLYCERIN (NITROSTAT) 0.4 MG SL tablet Place 0.4 mg under the tongue every 5 (five) minutes as needed. For chest pain    . rosuvastatin (CRESTOR) 10 MG tablet Take 1 tablet (10 mg total) by mouth 3 (three) times a week. 90 tablet 3   No current facility-administered medications for this visit.     Allergies:   Lipitor [atorvastatin calcium]; Zocor [simvastatin]; and Zyban [bupropion hcl]    Social History:  The patient  reports that he quit smoking about 5 years ago. His smoking use included Cigarettes. He has never used smokeless tobacco. He reports that he drinks alcohol. He reports that he does not use drugs.   Family History:  The patient's family history includes Diabetes in his father and mother; Heart disease in his father and mother; Prostate cancer in his father  and maternal uncle; Stroke in his mother.    ROS:  Please see the history of present illness.   Otherwise, review of systems are positive for easy bruising, nuisance bleeding.  No internal bleeding.   All other systems are reviewed and negative.    PHYSICAL EXAM: VS:  BP 130/78   Pulse (!) 55   Ht 5\' 5"  (1.651 m)   Wt 197 lb (89.4 kg)   BMI 32.78 kg/m  , BMI Body mass index is 32.78 kg/m. GEN: Well nourished, well developed, in no acute distress  HEENT: normal  Neck: no JVD, carotid bruits, or masses Cardiac: RRR; no murmurs, rubs, or gallops,no edema  Respiratory:  clear to auscultation bilaterally, normal work of breathing GI: soft, nontender, nondistended, + BS MS: no deformity or atrophy  Skin: warm and dry, no rash Neuro:  Strength and sensation are intact Psych: euthymic mood, full  affect   EKG:   The ekg ordered today demonstrates SB, nonspecific ST changes   Recent Labs: No results found for requested labs within last 8760 hours.   Lipid Panel No results found for: CHOL, TRIG, HDL, CHOLHDL, VLDL, LDLCALC, LDLDIRECT   Other studies Reviewed: Additional studies/ records that were reviewed today with results demonstrating: Cath results reviewed; 2.75 x 32 Promus to the LAD.     ASSESSMENT AND PLAN:  1. CAD: Recommended Stopping aspirin last year, given easy bruising, bleeding from scratches.  Continue clopidogrel.  Continue Nitroglycerin 0.4 mg tablet, 0.4 mg, 1 tablet as directed, SL, as directed prn chest pain. No angina since stent. He is willing to stop taking aspirin.   2. Mixed hyperlipidemia: Tolerating Crestor 3x/week. Dr. Inda Merlin checked his labs and lipids were well controlled on the most recent check.  LDL 100 in 11/17.  TG were in the 400s and have come down quite a bit. 3. HTN: COntinue losartan.  Check BP at home.  Let us know if BP over [id number] systolic consistently.  We can adjust medications.  4. PAD: known Right SFA disease. Claudication improved after stopping smoking. Encouraged walking. Very rare right leg pain, after walking a few flights of stairs, or up an incline.  This does not limit his activity.  No non healing sores on his feet.  He will let us know if that changes.   Current medicines are reviewed at length with the patient today.  The patient concerns regarding his medicines were addressed.  The following changes have been made:  No change, refills given fro losartan, clopidogrel and crestor  Labs/ tests ordered today include:  No orders of the defined types were placed in this encounter.   Recommend 150 minutes/week of aerobic exercise Low fat, low carb, high fiber diet recommended  Disposition:   FU in  1 year   Signed, Larae Grooms, MD  08/17/2016 4:17 PM    [facility] [place] [place],  [place], [place]  [address] Phone: [phone]; Fax: [phone]

## 2016-08-18 ENCOUNTER — Other Ambulatory Visit: Payer: Self-pay | Admitting: *Deleted

## 2016-08-18 MED ORDER — LOSARTAN POTASSIUM 50 MG PO TABS: 50.0000 mg | ORAL_TABLET | Freq: Every day | ORAL | 3 refills | Status: DC

## 2017-01-19 ENCOUNTER — Other Ambulatory Visit: Payer: Self-pay | Admitting: Interventional Cardiology

## 2017-01-20 NOTE — Telephone Encounter (Signed)
Refills already on file. Call and have transferred over if patient wants to be filled with mail order.   rosuvastatin (CRESTOR) 10 MG tablet  Medication  Date: 08/17/2016 Department: [facility] Office Ordering/Authorizing: Jettie Booze, MD  Order Providers   Prescribing Provider Encounter Provider  Jettie Booze, MD Jettie Booze, MD  Medication Detail    Disp Refills Start End   rosuvastatin (CRESTOR) 10 MG tablet 90 tablet 3 08/18/2016    Sig - Route: Take 1 tablet (10 mg total) by mouth 3 (three) times a week. - Oral   E-Prescribing Status: Receipt confirmed by pharmacy (08/17/2016 4:57 PM EST)   Pharmacy   [id number] - [place], [place].

## 2017-01-24 ENCOUNTER — Other Ambulatory Visit: Payer: Self-pay | Admitting: *Deleted

## 2017-01-24 MED ORDER — NITROGLYCERIN 0.4 MG SL SUBL: 0.4000 mg | SUBLINGUAL_TABLET | SUBLINGUAL | 3 refills | Status: DC | PRN

## 2017-01-25 ENCOUNTER — Other Ambulatory Visit: Payer: Self-pay

## 2017-01-25 MED ORDER — ROSUVASTATIN CALCIUM 10 MG PO TABS: 10.0000 mg | ORAL_TABLET | ORAL | 3 refills | Status: DC

## 2017-05-03 ENCOUNTER — Telehealth: Payer: Self-pay | Admitting: Interventional Cardiology

## 2017-05-03 NOTE — Telephone Encounter (Signed)
Spoke with patient and he c/o for the past month, he has been having an intermittent, dull pain in his chest rated 3/10 with sob. Patient said he left work early yesterday because of fatigue and stayed out of work today. Patient said the chest pain has happened 3-4 times over the past month. Patient did not sound sob while on the phone and was not having active chest pain. Patient said he notices the chest pain more at rest. No c/o dizziness, fever, chills or n/v. Patient said that he has not had to use his nitroglycerin. Patient said he did not need a note for work today. Patient was given an appointment to see his provider on Friday, 05/06/17 @4 :00 pm and was advised that if his symptoms got worse, that he needed to go to the ED for an evaluation. Patient verbalized understanding of plan.

## 2017-05-03 NOTE — Telephone Encounter (Signed)
Can start Imdur 30 mg daily

## 2017-05-03 NOTE — Telephone Encounter (Signed)
New message       Talk to the nurse about "some symptoms" he is having.  Pt would not tell me what the symptoms are.  He want to tell the nurse

## 2017-05-04 NOTE — Telephone Encounter (Signed)
Left message for patient to call back  

## 2017-05-04 NOTE — Telephone Encounter (Addendum)
Made patient aware of Dr. Hassell Done recommendations to start imdur 30 mg QD. Patient states that he wants to wait until after his appointment on Friday with Dr. Irish Lack before starting any new medicines. He states that his discomfort is not that severe and that he preferred to wait at this time.

## 2017-05-06 ENCOUNTER — Encounter: Payer: Self-pay | Admitting: Interventional Cardiology

## 2017-05-06 ENCOUNTER — Encounter (INDEPENDENT_AMBULATORY_CARE_PROVIDER_SITE_OTHER): Payer: Self-pay

## 2017-05-06 ENCOUNTER — Ambulatory Visit (INDEPENDENT_AMBULATORY_CARE_PROVIDER_SITE_OTHER): Payer: BLUE CROSS/BLUE SHIELD | Admitting: Interventional Cardiology

## 2017-05-06 VITALS — BP 140/66 | HR 71 | Ht 66.0 in | Wt 196.2 lb

## 2017-05-06 DIAGNOSIS — I10 Essential (primary) hypertension: Secondary | ICD-10-CM | POA: Diagnosis not present

## 2017-05-06 DIAGNOSIS — I25118 Atherosclerotic heart disease of native coronary artery with other forms of angina pectoris: Secondary | ICD-10-CM

## 2017-05-06 DIAGNOSIS — E782 Mixed hyperlipidemia: Secondary | ICD-10-CM

## 2017-05-06 MED ORDER — ROSUVASTATIN CALCIUM 10 MG PO TABS: 10.0000 mg | ORAL_TABLET | ORAL | 3 refills | Status: DC

## 2017-05-06 NOTE — Progress Notes (Signed)
Cardiology Office Note   Date:  05/06/2017   ID:  Paul, Tyler [birth year], MRN [id number]  PCP:  Darcus Austin, MD    Chief Complaint  Patient presents with  . Follow-up  CAD   Wt Readings from Last 3 Encounters:  05/06/17 196 lb 3.2 oz (89 kg)  08/17/16 197 lb (89.4 kg)  08/14/15 192 lb (87.1 kg)       History of Present Illness: Paul Tyler is a 66 y.o. male   who had an LAD stent done in 8/13, 2.75 x 32 Promus DES, postdilated to 3.5 mm.   He has been maintained on medical therapy since that time.    Of late, he has noted some pressure in his chest that occurs at rest, usually in the morning.  It does not limit his activity.  It lasts a few minutes and resolves on its own.  He has not had I with activity.  At times, he ash some SHOB with exertion, but not all of the time.  Sx are not like what he had before the stent.    Before his stent, he had a chest pain with a distinctive sharpness, that would come on with exertion and resolve with rest.  He continues to be active and exceed what he was doing in 2013, without anginal sx.  In 2013, he had to stop activity due to pain.  Now, he continues to exert without a problem.    Denies :  Dizziness. Leg edema. Nitroglycerin use. Orthopnea. Palpitations. Paroxysmal nocturnal dyspnea. Syncope.      Past Medical History:  Diagnosis Date  . Adenomatous colon polyp   . Anginal pain ([place])   . Anxiety   . Coronary artery disease   . Hyperlipidemia   . Hypertension   . Impotence of organic origin   . Insomnia, unspecified   . Peripheral vascular disease (HCC)    right leg  . Psoriasis   . PVD (peripheral vascular disease) ([place])   . Recurrent low back pain   . SOB (shortness of breath) on exertion     Past Surgical History:  Procedure Laterality Date  . CORONARY ANGIOPLASTY WITH STENT PLACEMENT  04/13/2012   MID LAD  . LEFT HEART CATHETERIZATION WITH CORONARY ANGIOGRAM N/A 04/13/2012   Procedure: LEFT  HEART CATHETERIZATION WITH CORONARY ANGIOGRAM;  Surgeon: Paul Booze, MD;  Location: [facility] CATH LAB;  Service: Cardiovascular;  Laterality: N/A;     Current Outpatient Prescriptions  Medication Sig Dispense Refill  . clopidogrel (PLAVIX) 75 MG tablet Take 75 mg by mouth daily.    Marland Kitchen ALPRAZolam (XANAX) 0.25 MG tablet Take 0.25 mg by mouth 2 (two) times daily as needed for anxiety.    Marland Kitchen aspirin 81 MG tablet Take 81 mg by mouth daily.    . Cholecalciferol (VITAMIN D) 2000 UNITS CAPS Take 1 capsule by mouth daily.     Marland Kitchen losartan (COZAAR) 50 MG tablet Take 1 tablet (50 mg total) by mouth daily. 90 tablet 3  . nitroGLYCERIN (NITROSTAT) 0.4 MG SL tablet Place 1 tablet (0.4 mg total) under the tongue every 5 (five) minutes as needed. For chest pain 25 tablet 3  . rosuvastatin (CRESTOR) 10 MG tablet Take 1 tablet (10 mg total) by mouth 3 (three) times a week. 45 tablet 3   No current facility-administered medications for this visit.     Allergies:   Lipitor [atorvastatin calcium]; Zocor [simvastatin]; and Zyban [bupropion hcl]  Social History:  The patient  reports that he quit smoking about 6 years ago. His smoking use included Cigarettes. He has never used smokeless tobacco. He reports that he drinks alcohol. He reports that he does not use drugs.   Family History:  The patient's family history includes Diabetes in his father and mother; Heart disease in his father and mother; Prostate cancer in his father and maternal uncle; Stroke in his mother.    ROS:  Please see the history of present illness.   Otherwise, review of systems are positive for chest pressure as nboted above.   All other systems are reviewed and negative.    PHYSICAL EXAM: VS:  BP 140/66   Pulse 71   Ht 5\' 6"  (1.676 m)   Wt 196 lb 3.2 oz (89 kg)   BMI 31.67 kg/m  , BMI Body mass index is 31.67 kg/m. GEN: Well nourished, well developed, in no acute distress  HEENT: normal  Neck: no JVD, carotid bruits, or  masses Cardiac: RRR; no murmurs, rubs, or gallops,no edema ; no pain to palpitation Respiratory:  clear to auscultation bilaterally, normal work of breathing GI: soft, nontender, nondistended, + BS MS: no deformity or atrophy  Skin: warm and dry, no rash Neuro:  Strength and sensation are intact Psych: euthymic mood, full affect   EKG:   The ekg ordered today demonstrates NSR, nonspecific ST changes; no significant change compared to prior   Recent Labs: No results found for requested labs within last 8760 hours.   Lipid Panel No results found for: CHOL, TRIG, HDL, CHOLHDL, VLDL, LDLCALC, LDLDIRECT   Other studies Reviewed: Additional studies/ records that were reviewed today with results demonstrating: cath results reviewed- noted above; lipids reviewed.   ASSESSMENT AND PLAN:  1. CAD:  Unclear sx.  CP, but  Not his typical angina like before, but something new compared to being asymptomatic. I recommended a treadmilll test.  He would like to hold off.  He will let us know if sx become more typical or if activity is affected.  We also discussed cath. 2. Hyperlipidemia: LDL 100 on Crestor 3x/week.  Lipids to be rechecked in November 2018. 3. Hypertensive heart disease: Blood pressure fairly well controlled today.  Better at home when he last checked.   Current medicines are reviewed at length with the patient today.  The patient concerns regarding his medicines were addressed.  The following changes have been made:  No change  Labs/ tests ordered today include:  No orders of the defined types were placed in this encounter.   Recommend 150 minutes/week of aerobic exercise Low fat, low carb, high fiber diet recommended  Disposition:   FU in 6 months   Signed, Larae Grooms, MD  05/06/2017 4:17 PM    [facility] [place], [place], [place]  [address] Phone: [phone]; Fax: [phone]

## 2017-05-06 NOTE — Patient Instructions (Signed)

## 2017-08-15 ENCOUNTER — Other Ambulatory Visit: Payer: Self-pay | Admitting: Interventional Cardiology

## 2017-09-21 ENCOUNTER — Telehealth: Payer: Self-pay

## 2017-09-21 NOTE — Telephone Encounter (Signed)
   [facility] Pre-operative Risk Assessment    Request for surgical clearance:  1. What type of surgery is being performed? Prostate Biopsy and ultrasound   2. When is this surgery scheduled? TBD   3. Are there any medications that need to be held prior to surgery and how long? Plavix for 7 days   4. Practice name and name of physician performing surgery?  1. [facility] 2. Dr. Jeffie Pollock   5. What is your office phone and fax number?  1. Phone: [phone] 2. Fax [phone]   6. Anesthesia type (None, local, MAC, general) ? None specified    _________________________________________________________________   (provider comments below)

## 2017-09-22 NOTE — Telephone Encounter (Signed)
Called the patient and left message for him to call back. During last visit, Dr. Irish Lack recommended stress test, but patient wished to hold off. I could not reach the patient to talk to him about any new symptom. Left message. During the mean time, will forward to Dr. Irish Lack to see if he would be ok with patient come off plavix for 7 days for his prostate [place] and biopsy assume his chest pain resolved. Last PCI 04/13/2012 with 2.75 x 32 promus DES to mid LAD.   Hilbert Corrigan PA Pager: [phone]

## 2017-09-23 NOTE — Telephone Encounter (Signed)
If he is symptom free with activity, he can come off of Plavix 7 days prior.

## 2017-09-26 NOTE — Telephone Encounter (Signed)
   Primary Cardiologist: Dr. Irish Lack  Chart reviewed as part of pre-operative protocol coverage. Given past medical history and time since last visit, based on ACC/AHA guidelines, Paul Tyler would be at acceptable risk for the planned procedure without further cardiovascular testing.  Pt has had no further discomfort that was mentioned in OV in August.  He can do 4 METS without anginal symptoms.  Dr. Irish Lack has reviewed and pt may stop plavix 7 days prior to procedure and resume when Dr. Jeffie Pollock determines It is safe to do so.  Pt is aware.   I will route this recommendation to the requesting party via Epic fax function and remove from pre-op pool.  Please call with questions.  Cecilie Kicks, NP 09/26/2017, 1:54 PM

## 2017-11-08 ENCOUNTER — Encounter: Payer: Self-pay | Admitting: Radiation Oncology

## 2017-12-01 ENCOUNTER — Encounter: Payer: Self-pay | Admitting: Radiation Oncology

## 2017-12-02 ENCOUNTER — Other Ambulatory Visit: Payer: Self-pay | Admitting: Family Medicine

## 2017-12-02 DIAGNOSIS — Z87891 Personal history of nicotine dependence: Secondary | ICD-10-CM

## 2017-12-05 ENCOUNTER — Ambulatory Visit
Admission: RE | Admit: 2017-12-05 | Discharge: 2017-12-05 | Disposition: A | Discharge status: Home / Self Care | Payer: BLUE CROSS/BLUE SHIELD | Source: Ambulatory Visit | Attending: Radiation Oncology | Admitting: Radiation Oncology

## 2017-12-05 ENCOUNTER — Ambulatory Visit: Payer: BLUE CROSS/BLUE SHIELD

## 2017-12-05 HISTORY — DX: Malignant neoplasm of prostate: C61

## 2017-12-06 ENCOUNTER — Encounter: Payer: Self-pay | Admitting: Interventional Cardiology

## 2017-12-06 ENCOUNTER — Ambulatory Visit (INDEPENDENT_AMBULATORY_CARE_PROVIDER_SITE_OTHER): Payer: BLUE CROSS/BLUE SHIELD | Admitting: Interventional Cardiology

## 2017-12-06 VITALS — BP 110/54 | HR 75 | Ht 66.0 in | Wt 201.8 lb

## 2017-12-06 DIAGNOSIS — I25118 Atherosclerotic heart disease of native coronary artery with other forms of angina pectoris: Secondary | ICD-10-CM

## 2017-12-06 DIAGNOSIS — E782 Mixed hyperlipidemia: Secondary | ICD-10-CM | POA: Diagnosis not present

## 2017-12-06 DIAGNOSIS — I10 Essential (primary) hypertension: Secondary | ICD-10-CM

## 2017-12-06 NOTE — Patient Instructions (Signed)

## 2017-12-06 NOTE — Progress Notes (Signed)
Cardiology Office Note   Date:  12/06/2017   ID:  Paul, Tyler 1951-03-20, MRN [id number]  PCP:  Darcus Austin, MD    No chief complaint on file.  CAD  Wt Readings from Last 3 Encounters:  12/06/17 201 lb 12.8 oz (91.5 kg)  05/06/17 196 lb 3.2 oz (89 kg)  08/17/16 197 lb (89.4 kg)       History of Present Illness: Paul Tyler is a 67 y.o. male  who had an LAD stent done in 8/13, 2.75 x 32 Promus DES, postdilated to 3.5 mm.   He has been maintained on medical therapy since that time.    At the last visit, he had noted some pressure in his chest that occurred at rest, usually in the morning.  Not related to activity.  Sx were not like what he had before the stent.  These sx resolved.  Before his stent, he had a chest pain with a distinctive sharpness, that would come on with exertion and resolve with rest.  He continues to be active and exceed what he was doing in 2013, without anginal sx.  In 2013, he had to stop activity due to pain.  Now, he continues to exert without a problem.    He was diagnosed with prostate cancer in early 2019.  He has a lung cancer screening CT scheduled as well.    Dr. Jeffie Pollock is following him and he is being observed.  He was off of the Plavix for the prostate biopsy.    Denies : Chest pain. Dizziness. Leg edema. Nitroglycerin use. Orthopnea. Palpitations. Paroxysmal nocturnal dyspnea. Shortness of breath. Syncope.      Past Medical History:  Diagnosis Date  . Adenomatous colon polyp   . Anginal pain ([place])   . Anxiety   . Coronary artery disease   . Hyperlipidemia   . Hypertension   . Impotence of organic origin   . Insomnia, unspecified   . Peripheral vascular disease (HCC)    right leg  . Prostate cancer ([place])   . Psoriasis   . PVD (peripheral vascular disease) ([place])   . Recurrent low back pain   . SOB (shortness of breath) on exertion     Past Surgical History:  Procedure Laterality Date  . CORONARY  ANGIOPLASTY WITH STENT PLACEMENT  04/13/2012   MID LAD  . LEFT HEART CATHETERIZATION WITH CORONARY ANGIOGRAM N/A 04/13/2012   Procedure: LEFT HEART CATHETERIZATION WITH CORONARY ANGIOGRAM;  Surgeon: Jettie Booze, MD;  Location: [facility] C[facility] LAB;  Service: Cardiovascular;  Laterality: N/A;  . PROSTATE BIOPSY       Current Outpatient Medications  Medication Sig Dispense Refill  . Cholecalciferol (VITAMIN D) 2000 UNITS CAPS Take 1 capsule by mouth daily.     . clopidogrel (PLAVIX) 75 MG tablet TAKE 1 TABLET DAILY 90 tablet 3  . losartan (COZAAR) 50 MG tablet TAKE 1 TABLET DAILY 90 tablet 3  . nitroGLYCERIN (NITROSTAT) 0.4 MG SL tablet Place 1 tablet (0.4 mg total) under the tongue every 5 (five) minutes as needed. For chest pain 25 tablet 3  . rosuvastatin (CRESTOR) 10 MG tablet Take 1 tablet (10 mg total) by mouth 3 (three) times a week. 45 tablet 3   No current facility-administered medications for this visit.     Allergies:   Lipitor [atorvastatin calcium]; Zyban [bupropion hcl]; and Zocor [simvastatin]    Social History:  The patient  reports that he quit smoking about 6 years  ago. His smoking use included cigarettes. He has never used smokeless tobacco. He reports that he drinks alcohol. He reports that he does not use drugs.   Family History:  The patient's family history includes Diabetes in his father and mother; Heart disease in his father and mother; Prostate cancer in his father and maternal uncle; Stroke in his mother.    ROS:  Please see the history of present illness.   Otherwise, review of systems are positive for recent prostate cancer diagnosis.   All other systems are reviewed and negative.    PHYSICAL EXAM: VS:  BP (!) 110/54   Pulse 75   Ht 5\' 6"  (1.676 m)   Wt 201 lb 12.8 oz (91.5 kg)   SpO2 95%   BMI 32.57 kg/m  , BMI Body mass index is 32.57 kg/m. GEN: Well nourished, well developed, in no acute distress  HEENT: normal  Neck: no JVD, carotid bruits, or  masses Cardiac: RRR; no murmurs, rubs, or gallops,no edema  Respiratory:  clear to auscultation bilaterally, normal work of breathing GI: soft, nontender, nondistended, + BS MS: no deformity or atrophy  Skin: warm and dry, no rash Neuro:  Strength and sensation are intact Psych: euthymic mood, full affect     Recent Labs: No results found for requested labs within last 8760 hours.   Lipid Panel No results found for: CHOL, TRIG, HDL, CHOLHDL, VLDL, LDLCALC, LDLDIRECT   Other studies Reviewed: Additional studies/ records that were reviewed today with results demonstrating: Labs reviewed.   ASSESSMENT AND PLAN:  1. CAD:  No further angina since the last visit.  COntinue medical therapy.  COntinue SL NTG prn.  He has not had to use this.  OK to hold Plavix 5 days ahead,  if surgery needed 2. Hyperlipidemia:  LDL 101 in 11/18.  Continue Crestor 10 mg daily, 3 x /week. 3. HTN: The current medical regimen is effective;  continue present plan and medications.    Current medicines are reviewed at length with the patient today.  The patient concerns regarding his medicines were addressed.  The following changes have been made:  No change  Labs/ tests ordered today include:  No orders of the defined types were placed in this encounter.   Recommend 150 minutes/week of aerobic exercise Low fat, low carb, high fiber diet recommended  Disposition:   FU in 1 year   Signed, Larae Grooms, MD  12/06/2017 4:07 PM    [facility] [address], [place], [place]  [address] Phone: [phone]; Fax: [phone]

## 2017-12-13 ENCOUNTER — Ambulatory Visit
Admission: RE | Admit: 2017-12-13 | Discharge: 2017-12-13 | Disposition: A | Discharge status: Home / Self Care | Payer: BLUE CROSS/BLUE SHIELD | Source: Ambulatory Visit | Attending: Family Medicine | Admitting: Family Medicine

## 2017-12-13 DIAGNOSIS — Z87891 Personal history of nicotine dependence: Secondary | ICD-10-CM

## 2018-01-04 ENCOUNTER — Encounter: Payer: Self-pay | Admitting: Radiation Oncology

## 2018-01-04 ENCOUNTER — Other Ambulatory Visit: Payer: Self-pay

## 2018-01-04 ENCOUNTER — Ambulatory Visit
Admission: RE | Admit: 2018-01-04 | Discharge: 2018-01-04 | Disposition: A | Discharge status: Home / Self Care | Payer: BLUE CROSS/BLUE SHIELD | Source: Ambulatory Visit | Attending: Radiation Oncology | Admitting: Radiation Oncology

## 2018-01-04 VITALS — BP 133/66 | HR 64 | Temp 98.3°F | Resp 18 | Wt 202.0 lb

## 2018-01-04 DIAGNOSIS — Z955 Presence of coronary angioplasty implant and graft: Secondary | ICD-10-CM | POA: Insufficient documentation

## 2018-01-04 DIAGNOSIS — E785 Hyperlipidemia, unspecified: Secondary | ICD-10-CM | POA: Diagnosis not present

## 2018-01-04 DIAGNOSIS — F419 Anxiety disorder, unspecified: Secondary | ICD-10-CM | POA: Diagnosis not present

## 2018-01-04 DIAGNOSIS — M545 Low back pain: Secondary | ICD-10-CM | POA: Diagnosis not present

## 2018-01-04 DIAGNOSIS — Z86010 Personal history of colonic polyps: Secondary | ICD-10-CM | POA: Insufficient documentation

## 2018-01-04 DIAGNOSIS — I739 Peripheral vascular disease, unspecified: Secondary | ICD-10-CM | POA: Insufficient documentation

## 2018-01-04 DIAGNOSIS — N521 Erectile dysfunction due to diseases classified elsewhere: Secondary | ICD-10-CM | POA: Diagnosis not present

## 2018-01-04 DIAGNOSIS — L409 Psoriasis, unspecified: Secondary | ICD-10-CM | POA: Diagnosis not present

## 2018-01-04 DIAGNOSIS — R0602 Shortness of breath: Secondary | ICD-10-CM | POA: Diagnosis not present

## 2018-01-04 DIAGNOSIS — I10 Essential (primary) hypertension: Secondary | ICD-10-CM | POA: Diagnosis not present

## 2018-01-04 DIAGNOSIS — Z79899 Other long term (current) drug therapy: Secondary | ICD-10-CM | POA: Insufficient documentation

## 2018-01-04 DIAGNOSIS — C61 Malignant neoplasm of prostate: Secondary | ICD-10-CM | POA: Insufficient documentation

## 2018-01-04 DIAGNOSIS — Z800 Family history of malignant neoplasm of digestive organs: Secondary | ICD-10-CM | POA: Diagnosis not present

## 2018-01-04 DIAGNOSIS — Z8546 Personal history of malignant neoplasm of prostate: Secondary | ICD-10-CM | POA: Insufficient documentation

## 2018-01-04 DIAGNOSIS — I2510 Atherosclerotic heart disease of native coronary artery without angina pectoris: Secondary | ICD-10-CM | POA: Insufficient documentation

## 2018-01-04 DIAGNOSIS — Z87891 Personal history of nicotine dependence: Secondary | ICD-10-CM | POA: Insufficient documentation

## 2018-01-04 NOTE — Progress Notes (Signed)
See progress note under physician encounter. 

## 2018-01-04 NOTE — Progress Notes (Signed)
GU Location of Tumor / Histology: prostatic adenocarcinoma  If Prostate Cancer, Gleason Score is (3 + 4) and PSA is (5.7). Prostate volume: 43 cc.   Paul Tyler was referred to Dr. Jeffie Pollock by Dr. Darcus Austin in January 2019 for further evaluation of an elevated PSA.   Biopsies of prostate (if applicable) revealed:    Past/Anticipated interventions by urology, if any: prostate biopsy, referral to Dr. Tammi Klippel for consideration of seeds or EXRT, referral to Dr. Alinda Money for consideration of surgery (pt has not seen borden yet)  Past/Anticipated interventions by medical oncology, if any: no  Weight changes, if any: no  Bowel/Bladder complaints, if any: IPSS 5. Reports nocturia x 0-1. Denies dysuria, hematuria, urinary leakage or incontinence.   Nausea/Vomiting, if any: no  Pain issues, if any:  no  SAFETY ISSUES:  Prior radiation? no  Pacemaker/ICD? no  Possible current pregnancy? no  Is the patient on methotrexate? no  Current Complaints / other details:  66 year old male. Married with one daughter. Works as a [employer] architect. Father has a hx of prostate cancer. Maternal uncles x 2 with prostate cancer. Maternal grandmother or grandfather with colon ca.

## 2018-01-04 NOTE — Progress Notes (Signed)
Radiation Oncology         [phone] ________________________________  Initial Outpatient Consultation  Name: Paul Tyler MRN: [id number]  Date: 01/04/2018  DOB: Feb 06, 1951  IO:NGEXB, Paul Penny, MD  [name], MD   REFERRING PHYSICIAN: [name], MD  DIAGNOSIS: 67 y.o. gentleman with stage T1c adenocarcinoma of the prostate with a Gleason's score of 3+4 and a PSA of 5.7    ICD-10-CM   1. Malignant neoplasm of prostate (Paul Tyler) Paul Tyler is a 67 y.o. gentleman with a diagnosis of prostate cancer.  He was noted to have an elevated PSA of 5.7 in November 2018 by his primary care physician, Dr. Darcus Austin.  His PSA has been rising over the past four years and was 3.4 in November 2017, 2.8 in October 2016, and 2.3 in July 2015.  Accordingly, he was referred for evaluation in urology to Dr. Jeffie Pollock on 09/19/2017, where a digital rectal examination was performed at that time revealing no prostate nodules.  The patient proceeded to transrectal ultrasound with 12 biopsies of the prostate on 10/12/2017.  On rectal exam, there were extensive stones in the right mid to apical transitional zones.  The prostate volume measured 43 cc.  Out of 12 core biopsies, 2 were positive.  The maximum Gleason score was 3+4, and this was seen in the left apex.  Additionally, there was Gleason 3+3 disease in the left apex lateral.    The patient reviewed the biopsy results with his urologist and he has kindly been referred today for discussion of potential radiation treatment options. He is accompanied by his wife.  Of note, the patient is on Plavix for a history of coronary artery disease s/p cardiac stent placement in 2013.  PREVIOUS RADIATION THERAPY: No  PAST MEDICAL HISTORY:  has a past medical history of Adenomatous colon polyp, Anginal pain (HCC), Anxiety, Coronary artery disease, Hyperlipidemia, Hypertension, Impotence of organic origin, Insomnia, unspecified,  Peripheral vascular disease ([place]), Prostate cancer ([name]), Psoriasis, PVD (peripheral vascular disease) ([name]), Recurrent low back pain, and SOB (shortness of breath) on exertion.    PAST SURGICAL HISTORY: Past Surgical History:  Procedure Laterality Date  . CORONARY ANGIOPLASTY WITH STENT PLACEMENT  04/13/2012   MID LAD  . LEFT HEART CATHETERIZATION WITH CORONARY ANGIOGRAM N/A 04/13/2012   Procedure: LEFT HEART CATHETERIZATION WITH CORONARY ANGIOGRAM;  Surgeon: Jettie Booze, MD;  Location: [facility] CATH LAB;  Service: Cardiovascular;  Laterality: N/A;  . PROSTATE BIOPSY      FAMILY HISTORY: family history includes Colon cancer in his maternal grandfather; Diabetes in his father and mother; Heart disease in his father and mother; Prostate cancer in his father, maternal uncle, and maternal uncle; Stroke in his mother.  SOCIAL HISTORY:  reports that he quit smoking about 6 years ago. His smoking use included cigarettes. He has a 45.00 pack-year smoking history. He has never used smokeless tobacco. He reports that he drinks alcohol. He reports that he does not use drugs. Married with one daughter. 2 young grandchildren. Works as a Naval architect.   ALLERGIES: Lipitor [atorvastatin calcium]; Zyban [bupropion hcl]; and Zocor [simvastatin]  MEDICATIONS:  Current Outpatient Medications  Medication Sig Dispense Refill  . Cholecalciferol (VITAMIN D) 2000 UNITS CAPS Take 1 capsule by mouth daily.     . clopidogrel (PLAVIX) 75 MG tablet TAKE 1 TABLET DAILY 90 tablet 3  . losartan (COZAAR) 50 MG tablet TAKE 1 TABLET DAILY 90 tablet 3  .  rosuvastatin (CRESTOR) 10 MG tablet Take 1 tablet (10 mg total) by mouth 3 (three) times a week. 45 tablet 3  . nitroGLYCERIN (NITROSTAT) 0.4 MG SL tablet Place 1 tablet (0.4 mg total) under the tongue every 5 (five) minutes as needed. For chest pain (Patient not taking: Reported on 01/04/2018) 25 tablet 3   No current facility-administered medications for this encounter.      REVIEW OF SYSTEMS:  A 15 point review of systems is documented in the electronic medical record. This was obtained by the nursing staff. However, I reviewed this with the patient to discuss relevant findings and make appropriate changes.  Pertinent items noted in HPI and remainder of comprehensive ROS otherwise negative.  The patient completed an IPSS and IIEF questionnaire.  His IPSS score was 5 indicating mild urinary outflow obstructive symptoms with nocturia x0-1.  He denies any dysuria, hematuria, leakage or incontinence.  He indicated that his erectile function is able to complete sexual activity almost never. He has a history of ED that did not respond to Viagra.    PHYSICAL EXAM: This patient is in no acute distress.  He is alert and oriented.   weight is 202 lb (91.6 kg). His oral temperature is 98.3 F (36.8 C). His blood pressure is 133/66 and his pulse is 64. His respiration is 18 and oxygen saturation is 96%.  He exhibits no respiratory distress or labored breathing.  He appears neurologically intact.  His mood is pleasant.  His affect is appropriate.  Please note the digital rectal exam findings described above.  KPS = 100  100 - Normal; no complaints; no evidence of disease. 90   - Able to carry on normal activity; minor signs or symptoms of disease. 80   - Normal activity with effort; some signs or symptoms of disease. 64   - Cares for self; unable to carry on normal activity or to do active work. 60   - Requires occasional assistance, but is able to care for most of his personal needs. 50   - Requires considerable assistance and frequent medical care. 23   - Disabled; requires special care and assistance. 64   - Severely disabled; hospital admission is indicated although death not imminent. 1   - Very sick; hospital admission necessary; active supportive treatment necessary. 10   - Moribund; fatal processes progressing rapidly. 0     - Dead  Karnofsky DA, Abelmann Culver,  Craver LS and Burchenal [facility] [phone]) The use of the nitrogen mustards in the palliative treatment of carcinoma: with particular reference to bronchogenic carcinoma Cancer 1 [phone]   LABORATORY DATA:  Lab Results  Component Value Date   WBC 8.6 04/14/2012   HGB 13.7 04/14/2012   HCT 40.0 04/14/2012   MCV 88.3 04/14/2012   PLT 185 04/14/2012   Lab Results  Component Value Date   NA 140 04/14/2012   K 4.1 04/14/2012   CL 104 04/14/2012   CO2 28 04/14/2012   No results found for: ALT, AST, GGT, ALKPHOS, BILITOT   RADIOGRAPHY: Ct Chest Lung Ca Screen Low Dose W/o Cm  Result Date: 12/15/2017 CLINICAL DATA:  Low dose Lung cancer screening. 69 pack-year history. Former asymptomatic smoker. EXAM: CT CHEST WITHOUT CONTRAST LOW-DOSE FOR LUNG CANCER SCREENING TECHNIQUE: Multidetector CT imaging of the chest was performed following the standard protocol without IV contrast. COMPARISON:  None FINDINGS: Cardiovascular: The heart size appears normal. Aortic atherosclerosis. Calcifications within the LAD, left circumflex and RCA coronary artery noted.  No pericardial effusion. Mediastinum/Nodes: The thyroid gland appears normal. The trachea appears patent and is midline. Unremarkable appearance of the esophagus. No enlarged mediastinal or hilar lymph nodes identified. Lungs/Pleura: Mild changes of emphysema. Mild diffuse bronchial wall thickening. There is a calcified nodule within the posterior right lower lobe. Upper Abdomen: No acute abnormality. Calcifications within the right adrenal gland noted which may be related to prior infection or hemorrhage. Unchanged from prior exam. Musculoskeletal: Spondylosis identified within the thoracic spine. No aggressive lytic or sclerotic bone lesions. IMPRESSION: 1. Lung-RADS 1, negative. Continue annual screening with low-dose chest CT without contrast in 12 months. 2. Aortic Atherosclerosis (ICD10-I70.0) and Emphysema (ICD10-J43.9). 3. LAD, left circumflex and RCA  coronary artery calcifications. Electronically Signed   By: Kerby Moors M.D.   On: 12/15/2017 10:56      IMPRESSION: This is a 67 y.o. gentleman with stage T1c adenocarcinoma of the prostate with a Gleason's score of 3+4 and a PSA of 5.7.  His T-Stage, Gleason's Score, and PSA put him into the favorable intermediate risk group.  Accordingly he is eligible for a variety of potential treatment options including prostatectomy, prostate brachytherapy, or external beam radiation. I discussed radiation treatment in the management of prostate cancer with regard to the logistics and delivery of external beam radiation treatment as well as the logistics and delivery of prostate brachytherapy.  I compared and contrasted each of these approaches and also compared these against prostatectomy. We also discussed the role of SpaceOAR in reducing the rectal toxicity associated with radiotherapy. Normally I would not recommend active surveillance for intermediate-risk prostate cancer, however given his current low PSA and small volume of disease, close observation with repeat PSA testing may be a reasonable option as well.  PLAN: The patient would like to proceed with active surveillance. I will share my findings with Dr. Jeffie Pollock and will look forward to following along in his progress.     I spent more than 50% of today's time face to face with the patient in counseling and/or coordination of care.   ------------------------------------------------  Sheral Apley. Tammi Klippel, M.D.  This document serves as a record of services personally performed by Tyler Pita, MD. It was created on his behalf by Rae Lips, a trained medical scribe. The creation of this record is based on the scribe's personal observations and the provider's statements to them. This document has been checked and approved by the attending provider.

## 2018-01-06 DIAGNOSIS — C61 Malignant neoplasm of prostate: Secondary | ICD-10-CM | POA: Insufficient documentation

## 2018-01-10 ENCOUNTER — Telehealth: Payer: Self-pay | Admitting: Medical Oncology

## 2018-01-10 NOTE — Telephone Encounter (Signed)
Called Paul Tyler as follow up to his consult with Dr. Tammi Klippel 01/04/18. I was unable to meet him that day and wanted to introduce myself as the prostate nurse navigator and my role. Per Dr. Johny Shears note he has chosen to continue active surveillance under the care of Dr. Jeffie Pollock. I asked him to call me if I can be of assistance in any way.

## 2018-05-01 ENCOUNTER — Other Ambulatory Visit: Payer: Self-pay | Admitting: Interventional Cardiology

## 2018-08-08 ENCOUNTER — Other Ambulatory Visit: Payer: Self-pay | Admitting: Urology

## 2018-08-08 DIAGNOSIS — C61 Malignant neoplasm of prostate: Secondary | ICD-10-CM

## 2018-08-17 ENCOUNTER — Telehealth: Payer: Self-pay

## 2018-08-17 NOTE — Telephone Encounter (Signed)
   [facility] Pre-operative Risk Assessment    Request for surgical clearance:  1. What type of surgery is being performed? Prostate Biopsy   2. When is this surgery scheduled?  ASAP  3. What type of clearance is required (medical clearance vs. Pharmacy clearance to hold med vs. Both)? Pharmacy  4. Are there any medications that need to be held prior to surgery and how long? Stop Plavix for 7 days   5. Practice name and name of physician performing surgery? [facility]   6. What is your office phone number  [phone]   7.   What is your office fax number  [phone]  8.   Anesthesia type (None, local, MAC, general) ? Unknown   Michaelyn Barter 08/17/2018, 1:31 PM  _________________________________

## 2018-08-18 NOTE — Telephone Encounter (Signed)
   Primary Cardiologist: Larae Grooms, MD  Chart reviewed as part of pre-operative protocol coverage. Patient was contacted 08/18/2018 in reference to pre-operative risk assessment for pending surgery as outlined below.  Paul Tyler was last seen on 12/06/17 by Dr. Irish Lack.  Since that day, AUREL NGUYEN has done well with no new cardiac symptoms. He has had no cardiac procedures since that time. OK to hold Plavix for 5-7 days prior to prostate biopsy or surgery. Resume once safe after procedure.   Therefore, based on ACC/AHA guidelines, the patient would be at acceptable risk for the planned procedure without further cardiovascular testing.   I will route this recommendation to the requesting party via Epic fax function and remove from pre-op pool.  Please call with questions.  Daune Perch, NP 08/18/2018, 1:01 PM

## 2018-08-20 ENCOUNTER — Ambulatory Visit
Admission: RE | Admit: 2018-08-20 | Discharge: 2018-08-20 | Disposition: A | Discharge status: Home / Self Care | Payer: BLUE CROSS/BLUE SHIELD | Source: Ambulatory Visit | Attending: Urology | Admitting: Urology

## 2018-08-20 DIAGNOSIS — C61 Malignant neoplasm of prostate: Secondary | ICD-10-CM

## 2018-08-20 MED ORDER — GADOBENATE DIMEGLUMINE 529 MG/ML IV SOLN
19.0000 mL | Freq: Once | INTRAVENOUS | Status: AC | PRN
  Administered 2018-08-20: 19 mL via INTRAVENOUS

## 2018-09-16 ENCOUNTER — Other Ambulatory Visit: Payer: Self-pay | Admitting: Interventional Cardiology

## 2018-10-30 ENCOUNTER — Telehealth: Payer: Self-pay | Admitting: *Deleted

## 2018-10-30 NOTE — Telephone Encounter (Signed)
   Primary Cardiologist: Larae Grooms, MD  Chart reviewed as part of pre-operative protocol coverage. Given past medical history and time since last visit, based on ACC/AHA guidelines, Paul Tyler would be at acceptable risk for the planned procedure without further cardiovascular testing. He can hold his plavix for 5 days.   I will route this recommendation to the requesting party via Epic fax function and remove from pre-op pool.  Please call with questions.  [place], [place] 10/30/2018, 3:34 PM

## 2018-10-30 NOTE — Telephone Encounter (Signed)
    [facility] Pre-operative Risk Assessment    Request for surgical clearance:  1. What type of surgery is being performed? COLONOSCOPY    2. When is this surgery scheduled? 12/01/18   3. What type of clearance is required (medical clearance vs. Pharmacy clearance to hold med vs. Both)? MEDICAL  4. Are there any medications that need to be held prior to surgery and how long?PLAVIX    5. Practice name and name of physician performing surgery? [facility]; DR. Michail Sermon   6. What is your office phone number [phone]    7.   What is your office fax number [phone]  8.   Anesthesia type (None, local, MAC, general) ? PROPOFOL ?    Paul Tyler 10/30/2018, 3:10 PM  _________________________________________________________________   (provider comments below)

## 2018-12-13 ENCOUNTER — Ambulatory Visit: Payer: BLUE CROSS/BLUE SHIELD | Admitting: Interventional Cardiology

## 2018-12-17 ENCOUNTER — Other Ambulatory Visit: Payer: Self-pay | Admitting: Interventional Cardiology

## 2018-12-19 ENCOUNTER — Other Ambulatory Visit: Payer: Self-pay | Admitting: Interventional Cardiology

## 2018-12-19 MED ORDER — LOSARTAN POTASSIUM 50 MG PO TABS: 50.0000 mg | ORAL_TABLET | Freq: Every day | ORAL | 0 refills | Status: DC

## 2018-12-19 MED ORDER — CLOPIDOGREL BISULFATE 75 MG PO TABS: 75.0000 mg | ORAL_TABLET | Freq: Every day | ORAL | 0 refills | Status: DC

## 2018-12-19 NOTE — Telephone Encounter (Signed)
Pt requested a 30 day supply until his appt with Dr. Irish Lack on 01/11/19. Confirmation received.

## 2019-01-03 ENCOUNTER — Telehealth: Payer: Self-pay

## 2019-01-03 NOTE — Telephone Encounter (Signed)
Virtual Visit Pre-Appointment Phone Call  TELEPHONE CALL NOTE  Paul Tyler has been deemed a candidate for a follow-up tele-health visit to limit community exposure during the Covid-19 pandemic. I spoke with the patient via phone to ensure availability of phone/video source, confirm preferred email & phone number, and discuss instructions and expectations.  I reminded Paul Tyler to be prepared with any vital sign and/or heart rhythm information that could potentially be obtained via home monitoring, at the time of his visit. I reminded Paul Tyler to expect a phone call prior to his visit.  Patient agrees to consent below.  Cleon Gustin, RN 01/03/2019 12:08 PM   FULL LENGTH CONSENT FOR TELE-HEALTH VISIT   I hereby voluntarily request, consent and authorize [facility] and its employed or contracted physicians, physician assistants, nurse practitioners or other licensed health care professionals (the Practitioner), to provide me with telemedicine health care services (the "Services") as deemed necessary by the treating Practitioner. I acknowledge and consent to receive the Services by the Practitioner via telemedicine. I understand that the telemedicine visit will involve communicating with the Practitioner through live audiovisual communication technology and the disclosure of certain medical information by electronic transmission. I acknowledge that I have been given the opportunity to request an in-person assessment or other available alternative prior to the telemedicine visit and am voluntarily participating in the telemedicine visit.  I understand that I have the right to withhold or withdraw my consent to the use of telemedicine in the course of my care at any time, without affecting my right to future care or treatment, and that the Practitioner or I may terminate the telemedicine visit at any time. I understand that I have the right to inspect all information  obtained and/or recorded in the course of the telemedicine visit and may receive copies of available information for a reasonable fee.  I understand that some of the potential risks of receiving the Services via telemedicine include:  Marland Kitchen Delay or interruption in medical evaluation due to technological equipment failure or disruption; . Information transmitted may not be sufficient (e.g. poor resolution of images) to allow for appropriate medical decision making by the Practitioner; and/or  . In rare instances, security protocols could fail, causing a breach of personal health information.  Furthermore, I acknowledge that it is my responsibility to provide information about my medical history, conditions and care that is complete and accurate to the best of my ability. I acknowledge that Practitioner's advice, recommendations, and/or decision may be based on factors not within their control, such as incomplete or inaccurate data provided by me or distortions of diagnostic images or specimens that may result from electronic transmissions. I understand that the practice of medicine is not an exact science and that Practitioner makes no warranties or guarantees regarding treatment outcomes. I acknowledge that I will receive a copy of this consent concurrently upon execution via email to the email address I last provided but may also request a printed copy by calling the office of [place].    I understand that my insurance will be billed for this visit.   I have read or had this consent read to me. . I understand the contents of this consent, which adequately explains the benefits and risks of the Services being provided via telemedicine.  . I have been provided ample opportunity to ask questions regarding this consent and the Services and have had my questions answered to my satisfaction. Marland Kitchen I  give my informed consent for the services to be provided through the use of telemedicine in my medical care   By participating in this telemedicine visit I agree to the above.

## 2019-01-04 ENCOUNTER — Telehealth (INDEPENDENT_AMBULATORY_CARE_PROVIDER_SITE_OTHER): Payer: Medicare Other | Admitting: Interventional Cardiology

## 2019-01-04 ENCOUNTER — Encounter: Payer: Self-pay | Admitting: Interventional Cardiology

## 2019-01-04 ENCOUNTER — Other Ambulatory Visit: Payer: Self-pay

## 2019-01-04 DIAGNOSIS — E782 Mixed hyperlipidemia: Secondary | ICD-10-CM

## 2019-01-04 DIAGNOSIS — I10 Essential (primary) hypertension: Secondary | ICD-10-CM

## 2019-01-04 DIAGNOSIS — I25118 Atherosclerotic heart disease of native coronary artery with other forms of angina pectoris: Secondary | ICD-10-CM | POA: Diagnosis not present

## 2019-01-04 DIAGNOSIS — I739 Peripheral vascular disease, unspecified: Secondary | ICD-10-CM | POA: Diagnosis not present

## 2019-01-04 MED ORDER — CLOPIDOGREL BISULFATE 75 MG PO TABS: 75.0000 mg | ORAL_TABLET | Freq: Every day | ORAL | 3 refills | Status: DC

## 2019-01-04 NOTE — Progress Notes (Signed)
Virtual Visit via Video Note   This visit type was conducted due to national recommendations for restrictions regarding the COVID-19 Pandemic (e.g. social distancing) in an effort to limit this patient's exposure and mitigate transmission in our community.  Due to his co-morbid illnesses, this patient is at least at moderate risk for complications without adequate follow up.  This format is felt to be most appropriate for this patient at this time.  All issues noted in this document were discussed and addressed.  A limited physical exam was performed with this format.  Please refer to the patient's chart for his consent to telehealth for [facility].   Evaluation Performed:  Follow-up visit  Date:  01/04/2019   ID:  Paul Tyler, Paul Tyler [birth year], MRN [id number]  Patient Location: Home Provider Location: Home  PCP:  Darcus Austin, MD (Inactive)  Cardiologist:  Larae Grooms, MD  Electrophysiologist:  None   Chief Complaint:  CAD  History of Present Illness:    Paul Tyler is a 68 y.o. male with who had an LAD stent done in 8/13, 2.75 x 32 Promus DES, postdilated to 3.5 mm.  Before his stent, he had a chest pain with a distinctive sharpness, that would come on with exertion and resolve with rest.  He has been maintained on medical therapy since that time.   He has had some intermittent chest discomfort, which resolved with medical therapy.   He was diagnosed with prostate cancer in early 2019.   Dr. Jeffie Pollock is following him and he is being observed.  He was off of the Plavix for the prostate biopsy.  He retired in Jan 2020.    He has gained weight since that time.  He is drinking more beer.  Since the weight gain, he has had more fatigue and DOE.  He reports some numbness inhis arms and legs if sleeps in certain positions.   No sx like what he had prior to his stent.  His exercise tolerance is decreased.    Denies : Chest pain. Dizziness. Leg edema.  Nitroglycerin use. Orthopnea. Palpitations. Paroxysmal nocturnal dyspnea. Syncope.   The patient does not have symptoms concerning for COVID-19 infection (fever, chills, cough, or new shortness of breath).    Past Medical History:  Diagnosis Date  . Adenomatous colon polyp   . Anginal pain ([place])   . Anxiety   . Coronary artery disease   . Hyperlipidemia   . Hypertension   . Impotence of organic origin   . Insomnia, unspecified   . Peripheral vascular disease (HCC)    right leg  . Prostate cancer ([place])   . Psoriasis   . PVD (peripheral vascular disease) ([place])   . Recurrent low back pain   . SOB (shortness of breath) on exertion    Past Surgical History:  Procedure Laterality Date  . CORONARY ANGIOPLASTY WITH STENT PLACEMENT  04/13/2012   MID LAD  . LEFT HEART CATHETERIZATION WITH CORONARY ANGIOGRAM N/A 04/13/2012   Procedure: LEFT HEART CATHETERIZATION WITH CORONARY ANGIOGRAM;  Surgeon: Jettie Booze, MD;  Location: [facility] CATH LAB;  Service: Cardiovascular;  Laterality: N/A;  . PROSTATE BIOPSY       Current Meds  Medication Sig  . Cholecalciferol (VITAMIN D) 2000 UNITS CAPS Take 1 capsule by mouth daily.   . clopidogrel (PLAVIX) 75 MG tablet Take 1 tablet (75 mg total) by mouth daily.  Marland Kitchen losartan (COZAAR) 50 MG tablet Take 1 tablet (50 mg total) by mouth daily.  Marland Kitchen  nitroGLYCERIN (NITROSTAT) 0.4 MG SL tablet Place 1 tablet (0.4 mg total) under the tongue every 5 (five) minutes as needed. For chest pain  . rosuvastatin (CRESTOR) 10 MG tablet TAKE 1 TABLET THREE TIMES A WEEK (Patient taking differently: Take 10 mg by mouth. Take one tablet by mouth 4 times a week.)     Allergies:   Lipitor [atorvastatin calcium]; Zyban [bupropion hcl]; and Zocor [simvastatin]   Social History   Tobacco Use  . Smoking status: Former Smoker    Packs/day: 1.50    Years: 30.00    Pack years: 45.00    Types: Cigarettes    Last attempt to quit: 04/18/2011    Years since quitting: 7.7  .  Smokeless tobacco: Never Used  . Tobacco comment: Patient is currently not smoking  Substance Use Topics  . Alcohol use: Yes    Alcohol/week: 3.0 - 4.0 standard drinks    Types: 3 - 4 Cans of beer per week    Comment: 3-4/day twice a week  . Drug use: No     Family Hx: The patient's family history includes Colon cancer in his maternal grandfather; Diabetes in his father and mother; Heart disease in his father and mother; Prostate cancer in his father, maternal uncle, and maternal uncle; Stroke in his mother. There is no history of Heart attack or Hypertension.  ROS:   Please see the history of present illness.    Increased anxiety, uses Xanax on occasion. All other systems reviewed and are negative.   Prior CV studies:   The following studies were reviewed today:  Cath results as noted.  Labs/Other Tests and Data Reviewed:    EKG:  An ECG dated 2018 was personally reviewed today and demonstrated:  NSR, NSST  Recent Labs: No results found for requested labs within last 8760 hours.   Recent Lipid Panel No results found for: CHOL, TRIG, HDL, CHOLHDL, LDLCALC, LDLDIRECT  Wt Readings from Last 3 Encounters:  01/04/19 210 lb (95.3 kg)  01/04/18 202 lb (91.6 kg)  12/06/17 201 lb 12.8 oz (91.5 kg)     Objective:    Vital Signs:  BP (!) 151/69   Pulse (!) 58   Ht 5\' 6"  (1.676 m)   Wt 210 lb (95.3 kg)   BMI 33.89 kg/m    VITAL SIGNS:  reviewed GEN:  no acute distress RESPIRATORY:  normal respiratory effort, symmetric expansion PSYCH:  normal affect exam limited by video format  ASSESSMENT & PLAN:    1. CAD: Mild DOE, may be related to deconditioning.  He will try to increase exercise gradually.  Clopidogrel monotherapy.  If he has anginal sx, he will let us know.  WOuld consider noninvasive testing if he DOE did not improve.  2. Hyperlipidemia: As below.  We spoke about dietary changes.   3. Obesity: LDL 87, TG 228 in 12/19.  Decrease alcohol intake and other  processed foods.  4. HTN: BP reading rechecked and the reading 136/71.  Continue current meds. 5. Plant based diet stressed.   6. PAD: stable.  Walking more will help.  Right SFA disease.  Watch for any nonhealing sores.   COVID-19 Education: The signs and symptoms of COVID-19 were discussed with the patient and how to seek care for testing (follow up with PCP or arrange E-visit).  The importance of social distancing was discussed today.  Time:   Today, I have spent 25 minutes with the patient with telehealth technology discussing the above problems.  Medication Adjustments/Labs and Tests Ordered: Current medicines are reviewed at length with the patient today.  Concerns regarding medicines are outlined above.   Tests Ordered: No orders of the defined types were placed in this encounter.   Medication Changes: No orders of the defined types were placed in this encounter.   Disposition:  Follow up in 6 month(s)  Signed, Larae Grooms, MD  01/04/2019 8:26 AM    [facility]

## 2019-01-04 NOTE — Patient Instructions (Signed)
Medication Instructions:  Your physician recommends that you continue on your current medications as directed. Please refer to the Current Medication list given to you today.  If you need a refill on your cardiac medications before your next appointment, please call your pharmacy.   Lab work: None Ordered  If you have labs (blood work) drawn today and your tests are completely normal, you will receive your results only by: Marland Kitchen MyChart Message (if you have MyChart) OR . A paper copy in the mail If you have any lab test that is abnormal or we need to change your treatment, we will call you to review the results.  Testing/Procedures: None ordered  Follow-Up: At [facility], you and your health needs are our priority.  As part of our continuing mission to provide you with exceptional heart care, we have created designated Provider Care Teams.  These Care Teams include your primary Cardiologist (physician) and Advanced Practice Providers (APPs -  Physician Assistants and Nurse Practitioners) who all work together to provide you with the care you need, when you need it. . You will need a follow up appointment in 6 months.  Please call our office 2 months in advance to schedule this appointment.  You may see Casandra Doffing, MD or one of the following Advanced Practice Providers on your designated Care Team:   . Lyda Jester, PA-C . Dayna Dunn, PA-C . Ermalinda Barrios, PA-C  Any Other Special Instructions Will Be Listed Below (If Applicable).  Your provider recommends that you maintain 150 minutes per week of moderate aerobic activity.

## 2019-01-11 ENCOUNTER — Ambulatory Visit: Payer: Self-pay | Admitting: Interventional Cardiology

## 2019-01-22 ENCOUNTER — Other Ambulatory Visit: Payer: Self-pay | Admitting: Interventional Cardiology

## 2019-01-22 NOTE — Telephone Encounter (Signed)
Pt's medication was sent to pt's pharmacy as requested. Confirmation received.  °

## 2019-02-26 DIAGNOSIS — N403 Nodular prostate with lower urinary tract symptoms: Secondary | ICD-10-CM | POA: Diagnosis not present

## 2019-02-26 DIAGNOSIS — C61 Malignant neoplasm of prostate: Secondary | ICD-10-CM | POA: Diagnosis not present

## 2019-02-26 DIAGNOSIS — R3915 Urgency of urination: Secondary | ICD-10-CM | POA: Diagnosis not present

## 2019-05-09 DIAGNOSIS — D123 Benign neoplasm of transverse colon: Secondary | ICD-10-CM | POA: Diagnosis not present

## 2019-05-09 DIAGNOSIS — D125 Benign neoplasm of sigmoid colon: Secondary | ICD-10-CM | POA: Diagnosis not present

## 2019-05-09 DIAGNOSIS — D124 Benign neoplasm of descending colon: Secondary | ICD-10-CM | POA: Diagnosis not present

## 2019-05-09 DIAGNOSIS — Z1211 Encounter for screening for malignant neoplasm of colon: Secondary | ICD-10-CM | POA: Diagnosis not present

## 2019-05-09 DIAGNOSIS — K640 First degree hemorrhoids: Secondary | ICD-10-CM | POA: Diagnosis not present

## 2019-05-09 DIAGNOSIS — D122 Benign neoplasm of ascending colon: Secondary | ICD-10-CM | POA: Diagnosis not present

## 2019-05-11 DIAGNOSIS — D125 Benign neoplasm of sigmoid colon: Secondary | ICD-10-CM | POA: Diagnosis not present

## 2019-05-11 DIAGNOSIS — D122 Benign neoplasm of ascending colon: Secondary | ICD-10-CM | POA: Diagnosis not present

## 2019-05-11 DIAGNOSIS — D124 Benign neoplasm of descending colon: Secondary | ICD-10-CM | POA: Diagnosis not present

## 2019-05-11 DIAGNOSIS — D123 Benign neoplasm of transverse colon: Secondary | ICD-10-CM | POA: Diagnosis not present

## 2019-05-17 DIAGNOSIS — C61 Malignant neoplasm of prostate: Secondary | ICD-10-CM | POA: Diagnosis not present

## 2019-05-24 DIAGNOSIS — R3915 Urgency of urination: Secondary | ICD-10-CM | POA: Diagnosis not present

## 2019-05-24 DIAGNOSIS — N403 Nodular prostate with lower urinary tract symptoms: Secondary | ICD-10-CM | POA: Diagnosis not present

## 2019-05-24 DIAGNOSIS — C61 Malignant neoplasm of prostate: Secondary | ICD-10-CM | POA: Diagnosis not present

## 2019-07-08 NOTE — Progress Notes (Signed)
Virtual Visit via Telephone Note   This visit type was conducted due to national recommendations for restrictions regarding the COVID-19 Pandemic (e.g. social distancing) in an effort to limit this patient's exposure and mitigate transmission in our community.  Due to his co-morbid illnesses, this patient is at least at moderate risk for complications without adequate follow up.  This format is felt to be most appropriate for this patient at this time.  The patient did not have access to video technology/had technical difficulties with video requiring transitioning to audio format only (telephone).  All issues noted in this document were discussed and addressed.  No physical exam could be performed with this format.  Please refer to the patient's chart for his  consent to telehealth for Paul Tyler.   Date:  07/09/2019   ID:  Paul Tyler, DOB [birth year], MRN [id number]  Patient Location: Home Provider Location: Home  PCP:  Glenis Smoker, MD  Cardiologist:  Larae Grooms, MD  Electrophysiologist:  None   Evaluation Performed:  Follow-Up Visit  Chief Complaint:  CAD  History of Present Illness:    Paul Tyler is a 68 y.o. male who had an LAD stent done in 8/13, 2.75 x 32 Promus DES, postdilated to 3.5 mm.  Before his stent, he had a chest pain with a distinctive sharpness, that would come on with exertion and resolve with rest.  He has been maintained on medical therapy since that time.    Evidence of right SFA disease in 2012.  He has had some intermittent chest discomfort, which resolved with medical therapy.   He was diagnosed with prostate cancer in early 2019.   Dr. Jeffie Pollock is following him and he is being observed. He was off of the Plavix for the prostate biopsy.  He retired in Jan 2020.    He has gained weight since that time.  He was drinking more beer.  Since the weight gain, he has had more fatigue and DOE.  He reports some numbness  inhis arms and legs if sleeps in certain positions.   Since the last visit, he has been less active. He has gained more weight since retirement.   Denies : Chest pain. Dizziness. Leg edema. Nitroglycerin use. Orthopnea. Palpitations. Paroxysmal nocturnal dyspnea. Shortness of breath. Syncope.   He has been careful wearing a mask and socially distancing.   The patient does not have symptoms concerning for COVID-19 infection (fever, chills, cough, or new shortness of breath).    Past Medical History:  Diagnosis Date  . Adenomatous colon polyp   . Anginal pain ([place])   . Anxiety   . Coronary artery disease   . Hyperlipidemia   . Hypertension   . Impotence of organic origin   . Insomnia, unspecified   . Peripheral vascular disease (HCC)    right leg  . Prostate cancer ([place])   . Psoriasis   . PVD (peripheral vascular disease) ([place])   . Recurrent low back pain   . SOB (shortness of breath) on exertion    Past Surgical History:  Procedure Laterality Date  . CORONARY ANGIOPLASTY WITH STENT PLACEMENT  04/13/2012   MID LAD  . LEFT HEART CATHETERIZATION WITH CORONARY ANGIOGRAM N/A 04/13/2012   Procedure: LEFT HEART CATHETERIZATION WITH CORONARY ANGIOGRAM;  Surgeon: Jettie Booze, MD;  Location: [facility] CATH LAB;  Service: Cardiovascular;  Laterality: N/A;  . PROSTATE BIOPSY       Current Meds  Medication Sig  . Cholecalciferol (VITAMIN  D) 2000 UNITS CAPS Take 1 capsule by mouth daily.   . clopidogrel (PLAVIX) 75 MG tablet Take 1 tablet (75 mg total) by mouth daily.  Marland Kitchen losartan (COZAAR) 50 MG tablet Take 1 tablet by mouth once daily  . nitroGLYCERIN (NITROSTAT) 0.4 MG SL tablet Place 1 tablet (0.4 mg total) under the tongue every 5 (five) minutes as needed. For chest pain  . rosuvastatin (CRESTOR) 10 MG tablet Take 10 mg by mouth 4 (four) times a week.     Allergies:   Lipitor [atorvastatin calcium], Zyban [bupropion hcl], and Zocor [simvastatin]   Social History   Tobacco Use   . Smoking status: Former Smoker    Packs/day: 1.50    Years: 30.00    Pack years: 45.00    Types: Cigarettes    Quit date: 04/18/2011    Years since quitting: 8.2  . Smokeless tobacco: Never Used  . Tobacco comment: Patient is currently not smoking  Substance Use Topics  . Alcohol use: Yes    Alcohol/week: 3.0 - 4.0 standard drinks    Types: 3 - 4 Cans of beer per week    Comment: 3-4/day twice a week  . Drug use: No     Family Hx: The patient's family history includes Colon cancer in his maternal grandfather; Diabetes in his father and mother; Heart disease in his father and mother; Prostate cancer in his father, maternal uncle, and maternal uncle; Stroke in his mother. There is no history of Heart attack or Hypertension.  ROS:   Please see the history of present illness.    Essentially inactive All other systems reviewed and are negative.   Prior CV studies:   The following studies were reviewed today:  Cath results reviewed  Labs/Other Tests and Data Reviewed:    EKG:  An ECG dated 2018 was personally reviewed today and demonstrated:  NSR, nonspecific ST changes  Recent Labs: No results found for requested labs within last 8760 hours.   Recent Lipid Panel No results found for: CHOL, TRIG, HDL, CHOLHDL, LDLCALC, LDLDIRECT  Wt Readings from Last 3 Encounters:  07/09/19 212 lb (96.2 kg)  01/04/19 210 lb (95.3 kg)  01/04/18 202 lb (91.6 kg)     Objective:    Vital Signs:  BP 137/79   Pulse (!) 55   Ht 5\' 4"  (1.626 m)   Wt 212 lb (96.2 kg)   BMI 36.39 kg/m    VITAL SIGNS:  reviewed GEN:  no acute distress RESPIRATORY:  normal respiratory effort, symmetric expansion NEURO:  alert and oriented x 3, no obvious focal deficit PSYCH:  normal affect exam limited by phone format  ASSESSMENT & PLAN:    1. CAD: Stressed plant based diet.  No angina.  He will be getting labs with his PMD in Jan 2021.  He should get an ECG at that visit as well. 2. Hyperlipidemia:  LDL 87.  Continue statin.  3. Obesity: We talked about decreasing intake and increasing exercise. 4. HTN: The current medical regimen is effective;  continue present plan and medications. Minimize salt intake. 5. PAD: Evidence of right SFA disease in 2012.  No symptoms currently.  COVID-19 Education: The signs and symptoms of COVID-19 were discussed with the patient and how to seek care for testing (follow up with PCP or arrange E-visit).  The importance of social distancing was discussed today.  Time:   Today, I have spent 25  minutes with the patient with telehealth technology discussing the above  problems.     Medication Adjustments/Labs and Tests Ordered: Current medicines are reviewed at length with the patient today.  Concerns regarding medicines are outlined above.   Tests Ordered: No orders of the defined types were placed in this encounter.   Medication Changes: No orders of the defined types were placed in this encounter.   Follow Up:  Either In Person or Virtual in 1 year(s)  Signed, Larae Grooms, MD  07/09/2019 8:50 AM    [place]

## 2019-07-09 ENCOUNTER — Encounter: Payer: Self-pay | Admitting: Interventional Cardiology

## 2019-07-09 ENCOUNTER — Telehealth (INDEPENDENT_AMBULATORY_CARE_PROVIDER_SITE_OTHER): Payer: Medicare Other | Admitting: Interventional Cardiology

## 2019-07-09 ENCOUNTER — Other Ambulatory Visit: Payer: Self-pay

## 2019-07-09 VITALS — BP 137/79 | HR 55 | Ht 64.0 in | Wt 212.0 lb

## 2019-07-09 DIAGNOSIS — E782 Mixed hyperlipidemia: Secondary | ICD-10-CM | POA: Diagnosis not present

## 2019-07-09 DIAGNOSIS — I739 Peripheral vascular disease, unspecified: Secondary | ICD-10-CM

## 2019-07-09 DIAGNOSIS — I25118 Atherosclerotic heart disease of native coronary artery with other forms of angina pectoris: Secondary | ICD-10-CM

## 2019-07-09 DIAGNOSIS — I10 Essential (primary) hypertension: Secondary | ICD-10-CM | POA: Diagnosis not present

## 2019-07-09 NOTE — Patient Instructions (Signed)

## 2019-09-17 DIAGNOSIS — I2510 Atherosclerotic heart disease of native coronary artery without angina pectoris: Secondary | ICD-10-CM | POA: Diagnosis not present

## 2019-09-17 DIAGNOSIS — E782 Mixed hyperlipidemia: Secondary | ICD-10-CM | POA: Diagnosis not present

## 2019-09-17 DIAGNOSIS — Z0000 Encounter for general adult medical examination without abnormal findings: Secondary | ICD-10-CM | POA: Diagnosis not present

## 2019-09-17 DIAGNOSIS — I10 Essential (primary) hypertension: Secondary | ICD-10-CM | POA: Diagnosis not present

## 2019-09-19 ENCOUNTER — Other Ambulatory Visit: Payer: Self-pay | Admitting: Family Medicine

## 2019-09-19 DIAGNOSIS — Z136 Encounter for screening for cardiovascular disorders: Secondary | ICD-10-CM

## 2019-09-21 ENCOUNTER — Ambulatory Visit
Admission: RE | Admit: 2019-09-21 | Discharge: 2019-09-21 | Disposition: A | Discharge status: Home / Self Care | Payer: Medicare Other | Source: Ambulatory Visit | Attending: Family Medicine | Admitting: Family Medicine

## 2019-09-21 DIAGNOSIS — Z87891 Personal history of nicotine dependence: Secondary | ICD-10-CM | POA: Diagnosis not present

## 2019-09-21 DIAGNOSIS — Z136 Encounter for screening for cardiovascular disorders: Secondary | ICD-10-CM | POA: Diagnosis not present

## 2019-10-03 DIAGNOSIS — E782 Mixed hyperlipidemia: Secondary | ICD-10-CM | POA: Diagnosis not present

## 2019-10-03 DIAGNOSIS — E559 Vitamin D deficiency, unspecified: Secondary | ICD-10-CM | POA: Diagnosis not present

## 2019-10-03 DIAGNOSIS — R7301 Impaired fasting glucose: Secondary | ICD-10-CM | POA: Diagnosis not present

## 2019-10-03 DIAGNOSIS — I10 Essential (primary) hypertension: Secondary | ICD-10-CM | POA: Diagnosis not present

## 2019-10-16 ENCOUNTER — Telehealth: Payer: Self-pay | Admitting: Interventional Cardiology

## 2019-10-16 NOTE — Telephone Encounter (Signed)
We are recommending the COVID-19 vaccine to all of our patients. Cardiac medications (including blood thinners) should not deter anyone from being vaccinated and there is no need to hold any of those medications prior to vaccine administration.     Currently, there is a hotline to call (active 09/21/19) to schedule vaccination appointments as no walk-ins will be accepted.   Number: [phone].    If an appointment is not available please go to [email or web address] to sign up for notification when additional vaccine appointments are available.   If you have further questions or concerns about the vaccine process, please visit [email or web address] or contact your primary care physician.   I have informed patient of instructions.   

## 2019-11-22 DIAGNOSIS — C61 Malignant neoplasm of prostate: Secondary | ICD-10-CM | POA: Diagnosis not present

## 2019-12-04 DIAGNOSIS — N403 Nodular prostate with lower urinary tract symptoms: Secondary | ICD-10-CM | POA: Diagnosis not present

## 2019-12-04 DIAGNOSIS — R3915 Urgency of urination: Secondary | ICD-10-CM | POA: Diagnosis not present

## 2019-12-04 DIAGNOSIS — R972 Elevated prostate specific antigen [PSA]: Secondary | ICD-10-CM | POA: Diagnosis not present

## 2019-12-04 DIAGNOSIS — C61 Malignant neoplasm of prostate: Secondary | ICD-10-CM | POA: Diagnosis not present

## 2020-01-29 ENCOUNTER — Other Ambulatory Visit: Payer: Self-pay | Admitting: Interventional Cardiology

## 2020-02-25 ENCOUNTER — Other Ambulatory Visit: Payer: Self-pay | Admitting: Interventional Cardiology

## 2020-02-25 DIAGNOSIS — C61 Malignant neoplasm of prostate: Secondary | ICD-10-CM | POA: Diagnosis not present

## 2020-03-03 DIAGNOSIS — N403 Nodular prostate with lower urinary tract symptoms: Secondary | ICD-10-CM | POA: Diagnosis not present

## 2020-03-03 DIAGNOSIS — R3915 Urgency of urination: Secondary | ICD-10-CM | POA: Diagnosis not present

## 2020-03-03 DIAGNOSIS — C61 Malignant neoplasm of prostate: Secondary | ICD-10-CM | POA: Diagnosis not present

## 2020-03-03 DIAGNOSIS — R3912 Poor urinary stream: Secondary | ICD-10-CM | POA: Diagnosis not present

## 2020-04-24 DIAGNOSIS — L989 Disorder of the skin and subcutaneous tissue, unspecified: Secondary | ICD-10-CM | POA: Diagnosis not present

## 2020-05-22 ENCOUNTER — Telehealth: Payer: Self-pay | Admitting: Interventional Cardiology

## 2020-05-22 MED ORDER — CLOPIDOGREL BISULFATE 75 MG PO TABS: 75.0000 mg | ORAL_TABLET | Freq: Every day | ORAL | 0 refills | Status: DC

## 2020-05-22 NOTE — Telephone Encounter (Signed)
Pt's medication was sent to pt's pharmacy as requested. Confirmation received.  °

## 2020-05-22 NOTE — Telephone Encounter (Signed)
*  STAT* If patient is at the pharmacy, call can be transferred to refill team.   1. Which medications need to be refilled? (please list name of each medication and dose if known) clopidogrel (PLAVIX) 75 MG tablet  2. Which pharmacy/location (including street and city if local pharmacy) is medication to be sent to?  [facility] ([place]), [place] - [address]     3. Do they need a 30 day or 90 day supply? 90 day supply

## 2020-07-09 ENCOUNTER — Ambulatory Visit: Payer: Medicare Other | Admitting: Interventional Cardiology

## 2020-07-23 ENCOUNTER — Other Ambulatory Visit: Payer: Self-pay | Admitting: Family Medicine

## 2020-07-23 ENCOUNTER — Ambulatory Visit
Admission: RE | Admit: 2020-07-23 | Discharge: 2020-07-23 | Disposition: A | Discharge status: Home / Self Care | Payer: Medicare Other | Source: Ambulatory Visit | Attending: Family Medicine | Admitting: Family Medicine

## 2020-07-23 ENCOUNTER — Telehealth: Payer: Self-pay | Admitting: Interventional Cardiology

## 2020-07-23 DIAGNOSIS — R0609 Other forms of dyspnea: Secondary | ICD-10-CM

## 2020-07-23 DIAGNOSIS — R0600 Dyspnea, unspecified: Secondary | ICD-10-CM

## 2020-07-23 DIAGNOSIS — R7301 Impaired fasting glucose: Secondary | ICD-10-CM | POA: Diagnosis not present

## 2020-07-23 DIAGNOSIS — Z23 Encounter for immunization: Secondary | ICD-10-CM | POA: Diagnosis not present

## 2020-07-23 NOTE — Telephone Encounter (Signed)
Stanton Kidney is returning Brittany's call. She gave her direct ext for callback and also states if she does not answer please leave the best callback time on the VM. Please advise.

## 2020-07-23 NOTE — Telephone Encounter (Signed)
Left message for Dr. Rosario Jacks RN to call back.

## 2020-07-23 NOTE — Telephone Encounter (Signed)
Dr. Lindell Noe with [facility] called in requesting to speak with Dr. Irish Lack regarding SOB. Patient has an appointment scheduled for 07/31/20 with Dr. Irish Lack, Dr. Dr. Lindell Noe would like to work patient in on Dr. Hassell Done schedule for sooner appointment (Thursday- 07/24/20 if possible).  Are we able to work patient in on quarter day tomorrow? Will secure chat Dr. Rosario Jacks contact information. Thank you.

## 2020-07-24 NOTE — Telephone Encounter (Signed)
Called and spoke to patient. Offered for patient to come in today at 10:20 but he cannot come in today. Patient will come in on Monday at 4:00 PM. Instructed the patient to let us know if his Sx changed or worsened before then.

## 2020-07-24 NOTE — Telephone Encounter (Signed)
Mary calling to see if there is an update on patient obtaining appt. Confirmed with [place] the patient is coming in on Monday at 4 pm. Let Stanton Kidney know - she does not need a call back.

## 2020-07-27 NOTE — Progress Notes (Signed)
Cardiology Office Note   Date:  07/28/2020   ID:  Paul, Tyler [birth year], MRN [id number]  PCP:  Glenis Smoker, MD    No chief complaint on file.  CAD  Wt Readings from Last 3 Encounters:  07/28/20 221 lb (100.2 kg)  07/09/19 212 lb (96.2 kg)  01/04/19 210 lb (95.3 kg)       History of Present Illness: Paul Tyler is a 69 y.o. male  who had an LAD stent done in 8/13, 2.75 x 32 Promus DES, postdilated to 3.5 mm.  Before his stent, he had a chest pain with a distinctive sharpness, that would come on with exertion and resolve with rest.  He has been maintained on medical therapy since that time.    Evidence of right SFA disease in 2012.  He has had some intermittent chest discomfort, which resolved with medical therapy.  He was diagnosed with prostate cancer in early 2019.   Dr. Jeffie Pollock is following him and he is being observed. He was off of the Plavix for the prostate biopsy.  He retired in Jan 2020.   In 06/2019, it was noted: "He has gained weight since that time. He was drinking more beer. Since the weight gain, he has had more fatigue and DOE. He reports some numbness in his arms and legs if sleeps in certain positions. "  He has had some increased shortness of breath of late.  He went to the [place] and he had to go up 3 flights of steps and he felt that he could not make it up. He does not walk up steps while at home.    He can walk on flat surfaces much easier.  He will occasionally have to stop wen he walks.     Past Medical History:  Diagnosis Date  . Adenomatous colon polyp   . Anginal pain ([place])   . Anxiety   . Coronary artery disease   . Hyperlipidemia   . Hypertension   . Impotence of organic origin   . Insomnia, unspecified   . Peripheral vascular disease (HCC)    right leg  . Prostate cancer ([place])   . Psoriasis   . PVD (peripheral vascular disease) ([place])   . Recurrent low back pain   . SOB (shortness  of breath) on exertion     Past Surgical History:  Procedure Laterality Date  . CORONARY ANGIOPLASTY WITH STENT PLACEMENT  04/13/2012   MID LAD  . LEFT HEART CATHETERIZATION WITH CORONARY ANGIOGRAM N/A 04/13/2012   Procedure: LEFT HEART CATHETERIZATION WITH CORONARY ANGIOGRAM;  Surgeon: Jettie Booze, MD;  Location: [facility] CATH LAB;  Service: Cardiovascular;  Laterality: N/A;  . PROSTATE BIOPSY       Current Outpatient Medications  Medication Sig Dispense Refill  . ALPRAZolam (XANAX) 0.25 MG tablet Take 0.125-0.25 mg by mouth every 8 (eight) hours.    . Cholecalciferol (VITAMIN D) 2000 UNITS CAPS Take 1 capsule by mouth daily.     . clopidogrel (PLAVIX) 75 MG tablet Take 1 tablet (75 mg total) by mouth daily. Please keep upcoming appt in October with Dr. Irish Lack for future refills. Thank you 90 tablet 0  . losartan (COZAAR) 50 MG tablet Take 1 tablet by mouth once daily 90 tablet 1  . nitroGLYCERIN (NITROSTAT) 0.4 MG SL tablet Place 1 tablet (0.4 mg total) under the tongue every 5 (five) minutes as needed. For chest pain 25 tablet 3  . rosuvastatin (CRESTOR) 10  MG tablet Take 10 mg by mouth 4 (four) times a week.    . sildenafil (VIAGRA) 100 MG tablet Take 100 mg by mouth as needed.     No current facility-administered medications for this visit.    Allergies:   Lipitor [atorvastatin calcium], Zyban [bupropion hcl], and Zocor [simvastatin]    Social History:  The patient  reports that he quit smoking about 9 years ago. His smoking use included cigarettes. He has a 45.00 pack-year smoking history. He has never used smokeless tobacco. He reports current alcohol use of about 3.0 - 4.0 standard drinks of alcohol per week. He reports that he does not use drugs.   Family History:  The patient's family history includes Colon cancer in his maternal grandfather; Diabetes in his father and mother; Heart disease in his father and mother; Prostate cancer in his father, maternal uncle, and  maternal uncle; Stroke in his mother.    ROS:  Please see the history of present illness.   Otherwise, review of systems are positive for DOE, weight gain.   All other systems are reviewed and negative.    PHYSICAL EXAM: VS:  BP 138/76   Pulse 75   Ht 5\' 5"  (1.651 m)   Wt 221 lb (100.2 kg)   SpO2 96%   BMI 36.78 kg/m  , BMI Body mass index is 36.78 kg/m. GEN: Well nourished, well developed, in no acute distress  HEENT: normal  Neck: no JVD, carotid bruits, or masses Cardiac: RRR; no murmurs, rubs, or gallops,no edema  Respiratory:  clear to auscultation bilaterally, normal work of breathing GI: soft, nontender, nondistended, + BS MS: no deformity or atrophy  Skin: warm and dry, no rash Neuro:  Strength and sensation are intact Psych: euthymic mood, full affect   EKG:   The ekg ordered today demonstrates NSR, no ST changes   Recent Labs: No results found for requested labs within last 8760 hours.   Lipid Panel No results found for: CHOL, TRIG, HDL, CHOLHDL, VLDL, LDLCALC, LDLDIRECT   Other studies Reviewed: Additional studies/ records that were reviewed today with results demonstrating: LDL 86 in Jan 2021.   ASSESSMENT AND PLAN:  1. CAD: COntinue aggressive secondary prevention.  Some DOE over the past 3-4 weeks.  He notes some wheezing as well.  Plan for chemical stress test.  If it is abnormal, he will need a cath. Patient is in agreement.  Procedure was described and all questions were answered.  2. Hyperlipidemia: The current medical regimen is effective;  continue present plan and medications. 3. Obesity: Long term, he will need to try to lose weight.  A1C 5.5. 4. HTN: The current medical regimen is effective;  continue present plan and medications. 5. PAD: Evidence of right SFA disease in 2012.    Current medicines are reviewed at length with the patient today.  The patient concerns regarding his medicines were addressed.  The following changes have been made:   No change  Labs/ tests ordered today include:  No orders of the defined types were placed in this encounter.   Recommend 150 minutes/week of aerobic exercise Low fat, low carb, high fiber diet recommended  Disposition:   FU based on stress test result   Signed, Larae Grooms, MD  07/28/2020 4:24 PM    [facility] [address], [place], [place]  [address] Phone: [phone]; Fax: [phone]

## 2020-07-28 ENCOUNTER — Other Ambulatory Visit: Payer: Self-pay

## 2020-07-28 ENCOUNTER — Ambulatory Visit: Payer: Medicare Other | Admitting: Interventional Cardiology

## 2020-07-28 ENCOUNTER — Encounter: Payer: Self-pay | Admitting: Interventional Cardiology

## 2020-07-28 VITALS — BP 138/76 | HR 75 | Ht 65.0 in | Wt 221.0 lb

## 2020-07-28 DIAGNOSIS — I739 Peripheral vascular disease, unspecified: Secondary | ICD-10-CM | POA: Diagnosis not present

## 2020-07-28 DIAGNOSIS — I25118 Atherosclerotic heart disease of native coronary artery with other forms of angina pectoris: Secondary | ICD-10-CM | POA: Diagnosis not present

## 2020-07-28 DIAGNOSIS — R0609 Other forms of dyspnea: Secondary | ICD-10-CM

## 2020-07-28 DIAGNOSIS — I10 Essential (primary) hypertension: Secondary | ICD-10-CM | POA: Diagnosis not present

## 2020-07-28 DIAGNOSIS — E782 Mixed hyperlipidemia: Secondary | ICD-10-CM

## 2020-07-28 DIAGNOSIS — R0600 Dyspnea, unspecified: Secondary | ICD-10-CM

## 2020-07-28 MED ORDER — LOSARTAN POTASSIUM 50 MG PO TABS: 50.0000 mg | ORAL_TABLET | Freq: Every day | ORAL | 3 refills | Status: DC

## 2020-07-28 NOTE — Patient Instructions (Signed)
Medication Instructions:  Your physician recommends that you continue on your current medications as directed. Please refer to the Current Medication list given to you today.  *If you need a refill on your cardiac medications before your next appointment, please call your pharmacy*   Lab Work: None  If you have labs (blood work) drawn today and your tests are completely normal, you will receive your results only by: . MyChart Message (if you have MyChart) OR . A paper copy in the mail If you have any lab test that is abnormal or we need to change your treatment, we will call you to review the results.   Testing/Procedures: Your physician has requested that you have a lexiscan myoview. For further information please visit [email or web address]. Please follow instruction sheet, as given.   Follow-Up: Based on test results   Other Instructions None  

## 2020-07-29 ENCOUNTER — Telehealth: Payer: Self-pay | Admitting: Interventional Cardiology

## 2020-07-29 ENCOUNTER — Other Ambulatory Visit: Payer: Self-pay | Admitting: Interventional Cardiology

## 2020-07-29 MED ORDER — NITROGLYCERIN 0.4 MG SL SUBL: 0.4000 mg | SUBLINGUAL_TABLET | SUBLINGUAL | 3 refills | Status: DC | PRN

## 2020-07-29 MED ORDER — CLOPIDOGREL BISULFATE 75 MG PO TABS: 75.0000 mg | ORAL_TABLET | Freq: Every day | ORAL | 3 refills | Status: DC

## 2020-07-29 NOTE — Telephone Encounter (Signed)
Called patient back about his medication question. Patient stated he tried to get his Plavix filled, but he needed a new prescription. Patient stated he wanted to know how much the Plavix would cost, but the pharmacy could not tell him since he did not have a new prescription. Sent in new prescription for Plavix to patient's pharmacy. Patient also requested a refill on nitro. Informed patient that he could not take his sildenafil with the nitro. Patient stated that he is not taking sildenafil, will remove it from his medication list. Sent in refill for nitro. Patient had no other questions or concerns.

## 2020-07-29 NOTE — Addendum Note (Signed)
Addended by: Carylon Perches on: 07/29/2020 01:34 PM   Modules accepted: Orders

## 2020-07-29 NOTE — Telephone Encounter (Signed)
Pt c/o medication issue:  1. Name of Medication: clopidogrel (PLAVIX) 75 MG tablet  2. How are you currently taking this medication (dosage and times per day)? As directed   3. Are you having a reaction (difficulty breathing--STAT)? Pt said he bruises easily   4. What is your medication issue? Pt said this medication is not in the standard formulary for his insurance. The patient wanted to know if there was an alternate medication that the patient could take that would be covered by his insurance.  If this is the best medication for the patient, he will need Dr. Irish Lack to override the insurance to get it approved.  The patient says he still has about four weeks of medication left. He just wanted to get a start on this so he doesn't run out

## 2020-07-31 ENCOUNTER — Ambulatory Visit: Payer: Medicare Other | Admitting: Interventional Cardiology

## 2020-08-04 ENCOUNTER — Encounter (HOSPITAL_COMMUNITY): Payer: Self-pay | Admitting: Interventional Cardiology

## 2020-08-14 DIAGNOSIS — N403 Nodular prostate with lower urinary tract symptoms: Secondary | ICD-10-CM | POA: Diagnosis not present

## 2020-08-14 DIAGNOSIS — R3915 Urgency of urination: Secondary | ICD-10-CM | POA: Diagnosis not present

## 2020-08-15 ENCOUNTER — Encounter (HOSPITAL_COMMUNITY): Payer: Medicare Other

## 2020-08-20 DIAGNOSIS — R8271 Bacteriuria: Secondary | ICD-10-CM | POA: Diagnosis not present

## 2020-08-20 DIAGNOSIS — N403 Nodular prostate with lower urinary tract symptoms: Secondary | ICD-10-CM | POA: Diagnosis not present

## 2020-08-20 DIAGNOSIS — R3912 Poor urinary stream: Secondary | ICD-10-CM | POA: Diagnosis not present

## 2020-08-20 DIAGNOSIS — C61 Malignant neoplasm of prostate: Secondary | ICD-10-CM | POA: Diagnosis not present

## 2020-08-20 DIAGNOSIS — R972 Elevated prostate specific antigen [PSA]: Secondary | ICD-10-CM | POA: Diagnosis not present

## 2020-08-22 ENCOUNTER — Other Ambulatory Visit: Payer: Self-pay | Admitting: Interventional Cardiology

## 2020-08-25 ENCOUNTER — Telehealth (HOSPITAL_COMMUNITY): Payer: Self-pay

## 2020-08-25 NOTE — Telephone Encounter (Signed)
Spoke with the patient, detailed instructions given. He stated that he understood and would be here for his test. Asked to call back with any questions. S.Trinda Harlacher EMTP 

## 2020-08-28 ENCOUNTER — Ambulatory Visit (HOSPITAL_COMMUNITY): Payer: Medicare Other | Attending: Interventional Cardiology

## 2020-08-28 ENCOUNTER — Other Ambulatory Visit: Payer: Self-pay

## 2020-08-28 DIAGNOSIS — I25118 Atherosclerotic heart disease of native coronary artery with other forms of angina pectoris: Secondary | ICD-10-CM

## 2020-08-28 LAB — MYOCARDIAL PERFUSION IMAGING
LV dias vol: 59 mL (ref 62–150)
LV sys vol: 17 mL
Peak HR: 84 {beats}/min
Rest HR: 64 {beats}/min
SDS: 0
SRS: 0
SSS: 0
TID: 1.04

## 2020-08-28 MED ORDER — AMINOPHYLLINE 25 MG/ML IV SOLN
75.0000 mg | Freq: Once | INTRAVENOUS | Status: AC
  Administered 2020-08-28: 75 mg via INTRAVENOUS

## 2020-09-23 DIAGNOSIS — I10 Essential (primary) hypertension: Secondary | ICD-10-CM | POA: Diagnosis not present

## 2020-09-23 DIAGNOSIS — I2510 Atherosclerotic heart disease of native coronary artery without angina pectoris: Secondary | ICD-10-CM | POA: Diagnosis not present

## 2020-09-23 DIAGNOSIS — Z0000 Encounter for general adult medical examination without abnormal findings: Secondary | ICD-10-CM | POA: Diagnosis not present

## 2020-09-23 DIAGNOSIS — E782 Mixed hyperlipidemia: Secondary | ICD-10-CM | POA: Diagnosis not present

## 2020-11-17 DIAGNOSIS — C61 Malignant neoplasm of prostate: Secondary | ICD-10-CM | POA: Diagnosis not present

## 2020-11-24 DIAGNOSIS — C61 Malignant neoplasm of prostate: Secondary | ICD-10-CM | POA: Diagnosis not present

## 2020-11-24 DIAGNOSIS — N5201 Erectile dysfunction due to arterial insufficiency: Secondary | ICD-10-CM | POA: Diagnosis not present

## 2021-01-15 ENCOUNTER — Other Ambulatory Visit: Payer: Self-pay | Admitting: Nurse Practitioner

## 2021-01-15 DIAGNOSIS — C61 Malignant neoplasm of prostate: Secondary | ICD-10-CM

## 2021-02-01 ENCOUNTER — Other Ambulatory Visit: Payer: Self-pay

## 2021-02-01 ENCOUNTER — Ambulatory Visit
Admission: RE | Admit: 2021-02-01 | Discharge: 2021-02-01 | Disposition: A | Discharge status: Home / Self Care | Payer: Medicare Other | Source: Ambulatory Visit | Attending: Nurse Practitioner | Admitting: Nurse Practitioner

## 2021-02-01 DIAGNOSIS — C61 Malignant neoplasm of prostate: Secondary | ICD-10-CM | POA: Diagnosis not present

## 2021-02-01 DIAGNOSIS — R590 Localized enlarged lymph nodes: Secondary | ICD-10-CM | POA: Diagnosis not present

## 2021-02-01 MED ORDER — GADOBENATE DIMEGLUMINE 529 MG/ML IV SOLN
20.0000 mL | Freq: Once | INTRAVENOUS | Status: AC | PRN
  Administered 2021-02-01: 20 mL via INTRAVENOUS

## 2021-03-04 DIAGNOSIS — C61 Malignant neoplasm of prostate: Secondary | ICD-10-CM | POA: Diagnosis not present

## 2021-03-11 DIAGNOSIS — N403 Nodular prostate with lower urinary tract symptoms: Secondary | ICD-10-CM | POA: Diagnosis not present

## 2021-03-11 DIAGNOSIS — C61 Malignant neoplasm of prostate: Secondary | ICD-10-CM | POA: Diagnosis not present

## 2021-03-11 DIAGNOSIS — R3915 Urgency of urination: Secondary | ICD-10-CM | POA: Diagnosis not present

## 2021-04-07 DIAGNOSIS — N62 Hypertrophy of breast: Secondary | ICD-10-CM | POA: Diagnosis not present

## 2021-04-07 DIAGNOSIS — Z5181 Encounter for therapeutic drug level monitoring: Secondary | ICD-10-CM | POA: Diagnosis not present

## 2021-04-19 ENCOUNTER — Encounter (HOSPITAL_COMMUNITY): Payer: Self-pay | Admitting: Emergency Medicine

## 2021-04-19 ENCOUNTER — Emergency Department (HOSPITAL_COMMUNITY)
Admission: EM | Admit: 2021-04-19 | Discharge: 2021-04-20 | Disposition: A | Discharge status: Home / Self Care | Payer: Medicare Other | Attending: Emergency Medicine | Admitting: Emergency Medicine

## 2021-04-19 ENCOUNTER — Other Ambulatory Visit: Payer: Self-pay

## 2021-04-19 DIAGNOSIS — Z87891 Personal history of nicotine dependence: Secondary | ICD-10-CM | POA: Insufficient documentation

## 2021-04-19 DIAGNOSIS — I10 Essential (primary) hypertension: Secondary | ICD-10-CM | POA: Diagnosis not present

## 2021-04-19 DIAGNOSIS — I2510 Atherosclerotic heart disease of native coronary artery without angina pectoris: Secondary | ICD-10-CM | POA: Diagnosis not present

## 2021-04-19 LAB — BASIC METABOLIC PANEL
Anion gap: 9 (ref 5–15)
BUN: 16 mg/dL (ref 8–23)
CO2: 27 mmol/L (ref 22–32)
Calcium: 9.4 mg/dL (ref 8.9–10.3)
Chloride: 102 mmol/L (ref 98–111)
Creatinine, Ser: 0.94 mg/dL (ref 0.61–1.24)
GFR, Estimated: >60 mL/min (ref >60)
Glucose, Bld: 140 mg/dL — ABNORMAL HIGH (ref 70–99)
Potassium: 4.7 mmol/L (ref 3.5–5.1)
Sodium: 138 mmol/L (ref 135–145)

## 2021-04-19 LAB — CBC WITH DIFFERENTIAL/PLATELET
Abs Immature Granulocytes: 0.04 10*3/uL (ref 0.00–0.07)
Basophils Absolute: 0.1 10*3/uL (ref 0.0–0.1)
Basophils Relative: 1 %
Eosinophils Absolute: 0.1 10*3/uL (ref 0.0–0.5)
Eosinophils Relative: 1 %
HCT: 45.9 % (ref 39.0–52.0)
Hemoglobin: 15 g/dL (ref 13.0–17.0)
Immature Granulocytes: 0 %
Lymphocytes Relative: 25 %
Lymphs Abs: 2.7 10*3/uL (ref 0.7–4.0)
MCH: 30.4 pg (ref 26.0–34.0)
MCHC: 32.7 g/dL (ref 30.0–36.0)
MCV: 93.1 fL (ref 80.0–100.0)
Monocytes Absolute: 0.6 10*3/uL (ref 0.1–1.0)
Monocytes Relative: 5 %
Neutro Abs: 7.5 10*3/uL (ref 1.7–7.7)
Neutrophils Relative %: 68 %
Platelets: 241 10*3/uL (ref 150–400)
RBC: 4.93 MIL/uL (ref 4.22–5.81)
RDW: 12.5 % (ref 11.5–15.5)
WBC: 11 10*3/uL — ABNORMAL HIGH (ref 4.0–10.5)
nRBC: 0 % (ref 0.0–0.2)

## 2021-04-19 NOTE — ED Provider Notes (Signed)
Emergency Medicine Provider Triage Evaluation Note  ADRIAL Tyler , a 70 y.o. male  was evaluated in triage.  Pt complains of elevated blood pressure readings today in the 200s. BP typically runs in 140. He has been compliant with his medications. He is currently asymptomatic. Denies headache, visual changes, chest pain, shortness of breath, lower extremity edema.   Review of Systems  Positive: Elevated BP Negative: headache  Physical Exam  BP (!) 205/80 (BP Location: Right Arm)   Pulse 68   Temp 98.4 F (36.9 C) (Oral)   Resp 20   Ht '5\' 6"'$  (1.676 m)   Wt 106 kg   SpO2 97%   BMI 37.72 kg/m  Gen:   Awake, no distress   Resp:  Normal effort  MSK:   Moves extremities without difficulty  Other:    Medical Decision Making  Medically screening exam initiated at 9:47 PM.  Appropriate orders placed.  Paul Tyler was informed that the remainder of the evaluation will be completed by another provider, this initial triage assessment does not replace that evaluation, and the importance of remaining in the ED until their evaluation is complete.  Labs to rule out end organ damage. Patient originally declined labs because he got them a few days ago. I had a long discussion with patient that his blood pressure was not elevated at the time of his recent labs and it was necessary to draw labs to check his kidney function and make sure there is no end organ damage due to his significantly elevated BP; however, if he did not want labs, I was more than happy to wait until he talked to a provider in the back. Patient agreeable to labs.    Paul Tyler [id number]    Delora Fuel, MD [id number] [phone]

## 2021-04-19 NOTE — ED Triage Notes (Signed)
Patient reports elevated blood pressure today Systolic = [id number] , denies SOB , no chest pain .

## 2021-04-20 DIAGNOSIS — I10 Essential (primary) hypertension: Secondary | ICD-10-CM | POA: Diagnosis not present

## 2021-04-20 LAB — URINALYSIS, ROUTINE W REFLEX MICROSCOPIC
Bilirubin Urine: NEGATIVE
Glucose, UA: NEGATIVE mg/dL
Hgb urine dipstick: NEGATIVE
Ketones, ur: NEGATIVE mg/dL
Leukocytes,Ua: NEGATIVE
Nitrite: NEGATIVE
Protein, ur: NEGATIVE mg/dL
Specific Gravity, Urine: 1.016 (ref 1.005–1.030)
pH: 5 (ref 5.0–8.0)

## 2021-04-20 NOTE — ED Provider Notes (Signed)
[facility] EMERGENCY DEPARTMENT Provider Note   CSN: [id number] Arrival date & time: 04/19/21  2132     History Chief Complaint  Patient presents with   Hypertension    Paul Tyler is a 70 y.o. male.  Patient with history of HTN, HLD, CAD, PVD, prostate CA presents with concern for elevated blood pressure. It was found to be elevated last week at a primary care appointment with systolic of [id number]. He has a pressure cuff at home and has been getting similar readings all week. Today his systolic went over [id number], with a high of 232. No chest pain, SOB, nausea, diaphoresis.   The history is provided by the patient. No language interpreter was used.  Hypertension      Past Medical History:  Diagnosis Date   Adenomatous colon polyp    Anginal pain ([place])    Anxiety    Coronary artery disease    Hyperlipidemia    Hypertension    Impotence of organic origin    Insomnia, unspecified    Peripheral vascular disease (HCC)    right leg   Prostate cancer (HCC)    Psoriasis    PVD (peripheral vascular disease) (HCC)    Recurrent low back pain    SOB (shortness of breath) on exertion     Patient Active Problem List   Diagnosis Date Noted   Malignant neoplasm of prostate ([place]) 01/06/2018   PAD (peripheral artery disease) ([place]) 08/14/2015   Coronary atherosclerosis of native coronary artery 01/29/2014   Essential hypertension, benign 01/29/2014   Mixed hyperlipidemia 01/29/2014   Claudication in peripheral vascular disease ([place]) 04/22/2011    Past Surgical History:  Procedure Laterality Date   CORONARY ANGIOPLASTY WITH STENT PLACEMENT  04/13/2012   MID LAD   LEFT HEART CATHETERIZATION WITH CORONARY ANGIOGRAM N/A 04/13/2012   Procedure: LEFT HEART CATHETERIZATION WITH CORONARY ANGIOGRAM;  Surgeon: Jettie Booze, MD;  Location: [facility] CATH LAB;  Service: Cardiovascular;  Laterality: N/A;   PROSTATE BIOPSY         Family History  Problem Relation Age of  Onset   Prostate cancer Father    Heart disease Father    Diabetes Father    Heart disease Mother    Stroke Mother        On Coumadin   Diabetes Mother        Borderline, no meds   Prostate cancer Maternal Uncle        prostate cancer   Colon cancer Maternal Grandfather    Prostate cancer Maternal Uncle    Heart attack Neg Hx    Hypertension Neg Hx     Social History   Tobacco Use   Smoking status: Former    Packs/day: 1.50    Years: 30.00    Pack years: 45.00    Types: Cigarettes    Quit date: 04/18/2011    Years since quitting: 10.0   Smokeless tobacco: Never   Tobacco comments:    Patient is currently not smoking  Vaping Use   Vaping Use: Never used  Substance Use Topics   Alcohol use: Yes    Alcohol/week: 3.0 - 4.0 standard drinks    Types: 3 - 4 Cans of beer per week    Comment: 3-4/day twice a week   Drug use: No    Home Medications Prior to Admission medications   Medication Sig Start Date End Date Taking? Authorizing Provider  ALPRAZolam (XANAX) 0.25 MG tablet Take 0.125-0.25 mg  by mouth every 8 (eight) hours. 04/21/20   [provider]  Cholecalciferol (VITAMIN D) 2000 UNITS CAPS Take 1 capsule by mouth daily.     [provider]  clopidogrel (PLAVIX) 75 MG tablet Take 1 tablet (75 mg total) by mouth daily. 07/29/20   Jettie Booze, MD  losartan (COZAAR) 50 MG tablet Take 1 tablet (50 mg total) by mouth daily. 07/28/20   Jettie Booze, MD  nitroGLYCERIN (NITROSTAT) 0.4 MG SL tablet Place 1 tablet (0.4 mg total) under the tongue every 5 (five) minutes as needed. For chest pain 07/29/20   Jettie Booze, MD  rosuvastatin (CRESTOR) 10 MG tablet Take 10 mg by mouth 4 (four) times a week.    [provider]    Allergies    Lipitor [atorvastatin calcium], Zyban [bupropion hcl], and Zocor [simvastatin]  Review of Systems   Review of Systems  Constitutional:  Negative for chills and fever.  HENT: Negative.     Respiratory: Negative.    Cardiovascular: Negative.   Gastrointestinal: Negative.   Musculoskeletal: Negative.   Skin: Negative.   Neurological: Negative.    Physical Exam Updated Vital Signs BP (!) 172/75 (BP Location: Right Arm)   Pulse (!) 58   Temp 98.9 F (37.2 C) (Oral)   Resp (!) 22   Ht '5\' 6"'$  (1.676 m)   Wt 106 kg   SpO2 96%   BMI 37.72 kg/m   Physical Exam Vitals and nursing note reviewed.  Constitutional:      Appearance: He is well-developed.  HENT:     Head: Normocephalic.  Cardiovascular:     Rate and Rhythm: Normal rate and regular rhythm.     Heart sounds: No murmur heard. Pulmonary:     Effort: Pulmonary effort is normal.     Breath sounds: Normal breath sounds. No wheezing, rhonchi or rales.  Abdominal:     General: Bowel sounds are normal.     Palpations: Abdomen is soft.     Tenderness: There is no abdominal tenderness. There is no guarding or rebound.  Musculoskeletal:        General: Normal range of motion.     Cervical back: Normal range of motion and neck supple.     Right lower leg: No edema.     Left lower leg: No edema.  Skin:    General: Skin is warm and dry.  Neurological:     General: No focal deficit present.     Mental Status: He is alert and oriented to person, place, and time.    ED Results / Procedures / Treatments   Labs (all labs ordered are listed, but only abnormal results are displayed) Labs Reviewed  CBC WITH DIFFERENTIAL/PLATELET - Abnormal; Notable for the following components:      Result Value   WBC 11.0 (*)    All other components within normal limits  BASIC METABOLIC PANEL - Abnormal; Notable for the following components:   Glucose, Bld 140 (*)    All other components within normal limits  URINALYSIS, ROUTINE W REFLEX MICROSCOPIC   Results for orders placed or performed during the hospital encounter of 04/19/21  CBC with Differential  Result Value Ref Range   WBC 11.0 (H) 4.0 - 10.5 K/uL   RBC 4.93 4.22 -  5.81 MIL/uL   Hemoglobin 15.0 13.0 - 17.0 g/dL   HCT 45.9 39.0 - 52.0 %   MCV 93.1 80.0 - 100.0 fL   MCH 30.4 26.0 -  34.0 pg   MCHC 32.7 30.0 - 36.0 g/dL   RDW 12.5 11.5 - 15.5 %   Platelets 241 150 - 400 K/uL   nRBC 0.0 0.0 - 0.2 %   Neutrophils Relative % 68 %   Neutro Abs 7.5 1.7 - 7.7 K/uL   Lymphocytes Relative 25 %   Lymphs Abs 2.7 0.7 - 4.0 K/uL   Monocytes Relative 5 %   Monocytes Absolute 0.6 0.1 - 1.0 K/uL   Eosinophils Relative 1 %   Eosinophils Absolute 0.1 0.0 - 0.5 K/uL   Basophils Relative 1 %   Basophils Absolute 0.1 0.0 - 0.1 K/uL   Immature Granulocytes 0 %   Abs Immature Granulocytes 0.04 0.00 - 0.07 K/uL  Basic metabolic panel  Result Value Ref Range   Sodium 138 135 - 145 mmol/L   Potassium 4.7 3.5 - 5.1 mmol/L   Chloride 102 98 - 111 mmol/L   CO2 27 22 - 32 mmol/L   Glucose, Bld 140 (H) 70 - 99 mg/dL   BUN 16 8 - 23 mg/dL   Creatinine, Ser 0.94 0.61 - 1.24 mg/dL   Calcium 9.4 8.9 - 10.3 mg/dL   GFR, Estimated >60 >60 mL/min   Anion gap 9 5 - 15  Urinalysis, Routine w reflex microscopic Urine, Clean Catch  Result Value Ref Range   Color, Urine YELLOW YELLOW   APPearance CLEAR CLEAR   Specific Gravity, Urine 1.016 1.005 - 1.030   pH 5.0 5.0 - 8.0   Glucose, UA NEGATIVE NEGATIVE mg/dL   Hgb urine dipstick NEGATIVE NEGATIVE   Bilirubin Urine NEGATIVE NEGATIVE   Ketones, ur NEGATIVE NEGATIVE mg/dL   Protein, ur NEGATIVE NEGATIVE mg/dL   Nitrite NEGATIVE NEGATIVE   Leukocytes,Ua NEGATIVE NEGATIVE     EKG None  Radiology No results found.  Procedures Procedures   Medications Ordered in ED Medications - No data to display  ED Course  I have reviewed the triage vital signs and the nursing notes.  Pertinent labs & imaging results that were available during my care of the patient were reviewed by me and considered in my medical decision making (see chart for details).    MDM Rules/Calculators/A&P                           Patient to ED  with concern for elevated blood pressure readings without symptoms, first noticed one week ago.   The patient is in NAD. Blood pressure mildly elevated in the ED, 200/80, 172/75, 161/73. Labs unremarkable.   The patient plans to follow up with Dr. Irish Lack, who manages his blood pressure this week. He is encouraged to measure blood pressure in the am, afternoon and evening and keep a log to review with Dr. Irish Lack.    Final Clinical Impression(s) / ED Diagnoses Final diagnoses:  None   Hypertension  Rx / DC Orders ED Discharge Orders     None        Dennie Bible [id number]    Delora Fuel, MD [id number] [phone]

## 2021-04-20 NOTE — ED Notes (Signed)
When asked the reason for his visit  The pt responded that they had already seen a pa.  Read the pas notes    Reason hes here

## 2021-04-20 NOTE — Discharge Instructions (Addendum)
Follow up with Dr. Irish Lack as planned. Measure blood pressure in the morning, afternoon and evening and keep a journal of the readings to review with him.   Return to the ED with any new or concerning symptoms at any time.

## 2021-04-21 ENCOUNTER — Telehealth: Payer: Self-pay | Admitting: Interventional Cardiology

## 2021-04-21 NOTE — Telephone Encounter (Signed)
Spoke with pt a few days ago had elevated B/P pt had concerns [facility] was told to go to ED due to systolic running over [id number] Pt ws told to f/u with card  Pt currently taking Losartan 50 mg every day Appt made for 05/06/21 at 11:30 am with Dr Irish Lack.Will forward to Dr Irish Lack for review and recommendations cy

## 2021-04-21 NOTE — Telephone Encounter (Signed)
Pt c/o BP issue: STAT if pt c/o blurred vision, one-sided weakness or slurred speech  1. What are your last 5 BP readings? Yesterday 150/77 HR 58; Today 164/65 HR 56  2. Are you having any other symptoms (ex. Dizziness, headache, blurred vision, passed out)? no  3. What is your BP issue? high .

## 2021-04-21 NOTE — Telephone Encounter (Signed)
Increase losartan to 100 mg daily for BP control.

## 2021-04-22 MED ORDER — LOSARTAN POTASSIUM 100 MG PO TABS: 100.0000 mg | ORAL_TABLET | Freq: Every day | ORAL | 3 refills | Status: DC

## 2021-04-22 NOTE — Telephone Encounter (Signed)
Patient notified. He will continue to monitor BP/heart rate and bring readings to upcoming appointment.

## 2021-04-29 ENCOUNTER — Other Ambulatory Visit: Payer: Self-pay | Admitting: Family Medicine

## 2021-04-29 DIAGNOSIS — I10 Essential (primary) hypertension: Secondary | ICD-10-CM | POA: Diagnosis not present

## 2021-04-29 DIAGNOSIS — N62 Hypertrophy of breast: Secondary | ICD-10-CM

## 2021-04-29 DIAGNOSIS — E782 Mixed hyperlipidemia: Secondary | ICD-10-CM | POA: Diagnosis not present

## 2021-04-29 DIAGNOSIS — F419 Anxiety disorder, unspecified: Secondary | ICD-10-CM | POA: Diagnosis not present

## 2021-04-29 DIAGNOSIS — I2510 Atherosclerotic heart disease of native coronary artery without angina pectoris: Secondary | ICD-10-CM | POA: Diagnosis not present

## 2021-04-30 ENCOUNTER — Other Ambulatory Visit: Payer: Self-pay

## 2021-04-30 ENCOUNTER — Ambulatory Visit
Admission: RE | Admit: 2021-04-30 | Discharge: 2021-04-30 | Disposition: A | Discharge status: Home / Self Care | Payer: Medicare Other | Source: Ambulatory Visit | Attending: Family Medicine | Admitting: Family Medicine

## 2021-04-30 ENCOUNTER — Ambulatory Visit: Payer: Medicare Other

## 2021-04-30 DIAGNOSIS — N62 Hypertrophy of breast: Secondary | ICD-10-CM | POA: Diagnosis not present

## 2021-05-05 NOTE — Progress Notes (Signed)
Cardiology Office Note   Date:  05/06/2021   ID:  Rustam, Squillace 1950/11/08, MRN [id number]  PCP:  Glenis Smoker, MD    No chief complaint on file.  CAD  Wt Readings from Last 3 Encounters:  05/06/21 218 lb 3.2 oz (99 kg)  04/19/21 233 lb 11 oz (106 kg)  08/28/20 221 lb (100.2 kg)       History of Present Illness: Paul Tyler is a 70 y.o. male  who had an LAD stent done in 8/13, 2.75 x 32 Promus DES, postdilated to 3.5 mm.    Before his stent, he had a chest pain with a distinctive sharpness, that would come on with exertion and resolve with rest.   He has been maintained on medical therapy since that time.     Evidence of right SFA disease in 2012.  Saw Dr. Oneida Alar.   He has had some intermittent chest discomfort, which resolved with medical therapy.    He was diagnosed with prostate cancer in early 2019.    Dr. Jeffie Pollock is following him and he is being observed.  He was off of the Plavix for the prostate biopsy.   He retired in Jan 2020.     In 06/2019, it was noted: "He has gained weight since that time.  He was drinking more beer.  Since the weight gain, he has had more fatigue and DOE.  He reports some numbness in his arms and legs if sleeps in certain positions. "   He had some increased shortness of breath and chemical stress test was done in late 2021 showing: "Nuclear stress EF: 72%. The left ventricular ejection fraction is hyperdynamic (>65%). There was no ST segment deviation noted during stress. This is a low risk study. There is no evidence of ischemia and no evidence of previous infarction. The study is normal."   BP was increased in 04/2021 and he went to the ER.  Chlorthalidone was added.  BPs decreased to the [id number] systolic at home.   He has prostate cancer which gives some bladder issues.   Denies : Chest pain. Dizziness. Leg edema. Nitroglycerin use. Orthopnea. Palpitations. Paroxysmal nocturnal dyspnea. Shortness of breath.  Syncope.    No nonhealing sores.    Past Medical History:  Diagnosis Date   Adenomatous colon polyp    Anginal pain ([place])    Anxiety    Coronary artery disease    Hyperlipidemia    Hypertension    Impotence of organic origin    Insomnia, unspecified    Peripheral vascular disease (HCC)    right leg   Prostate cancer (HCC)    Psoriasis    PVD (peripheral vascular disease) (HCC)    Recurrent low back pain    SOB (shortness of breath) on exertion     Past Surgical History:  Procedure Laterality Date   CORONARY ANGIOPLASTY WITH STENT PLACEMENT  04/13/2012   MID LAD   LEFT HEART CATHETERIZATION WITH CORONARY ANGIOGRAM N/A 04/13/2012   Procedure: LEFT HEART CATHETERIZATION WITH CORONARY ANGIOGRAM;  Surgeon: Jettie Booze, MD;  Location: [facility] CATH LAB;  Service: Cardiovascular;  Laterality: N/A;   PROSTATE BIOPSY       Current Outpatient Medications  Medication Sig Dispense Refill   ALPRAZolam (XANAX) 0.25 MG tablet Take 0.125-0.25 mg by mouth every 8 (eight) hours.     chlorthalidone (HYGROTON) 25 MG tablet Take 25 mg by mouth every morning.     Cholecalciferol (VITAMIN D)  2000 UNITS CAPS Take 1 capsule by mouth daily.      clopidogrel (PLAVIX) 75 MG tablet Take 1 tablet (75 mg total) by mouth daily. 90 tablet 3   losartan (COZAAR) 100 MG tablet Take 1 tablet (100 mg total) by mouth daily. 90 tablet 3   nitroGLYCERIN (NITROSTAT) 0.4 MG SL tablet Place 1 tablet (0.4 mg total) under the tongue every 5 (five) minutes as needed. For chest pain 25 tablet 3   rosuvastatin (CRESTOR) 10 MG tablet Take 10 mg by mouth 4 (four) times a week.     tadalafil (CIALIS) 5 MG tablet Take 5 mg by mouth as needed.     No current facility-administered medications for this visit.    Allergies:   Lipitor [atorvastatin calcium], Zyban [bupropion hcl], and Zocor [simvastatin]    Social History:  The patient  reports that he quit smoking about 10 years ago. His smoking use included cigarettes.  He has a 45.00 pack-year smoking history. He has never used smokeless tobacco. He reports current alcohol use of about 3.0 - 4.0 standard drinks per week. He reports that he does not use drugs.   Family History:  The patient's family history includes Colon cancer in his maternal grandfather; Diabetes in his father and mother; Heart disease in his father and mother; Prostate cancer in his father, maternal uncle, and maternal uncle; Stroke in his mother.    ROS:  Please see the history of present illness.   Otherwise, review of systems are positive for intentional weight loss.   All other systems are reviewed and negative.    PHYSICAL EXAM: VS:  BP 138/72   Pulse 68   Ht '5\' 5"'$  (1.651 m)   Wt 218 lb 3.2 oz (99 kg)   SpO2 93%   BMI 36.31 kg/m  , BMI Body mass index is 36.31 kg/m. GEN: Well nourished, well developed, in no acute distress HEENT: normal Neck: no JVD, carotid bruits, or masses Cardiac: RRR; no murmurs, rubs, or gallops,no edema  Respiratory:  clear to auscultation bilaterally, normal work of breathing GI: soft, nontender, nondistended, + BS MS: no deformity or atrophy Skin: warm and dry, no rash Neuro:  Strength and sensation are intact Psych: euthymic mood, full affect   EKG:   The ekg ordered today demonstrates NSR, RBBB   Recent Labs: 04/19/2021: BUN 16; Creatinine, Ser 0.94; Hemoglobin 15.0; Platelets 241; Potassium 4.7; Sodium 138   Lipid Panel No results found for: CHOL, TRIG, HDL, CHOLHDL, VLDL, LDLCALC, LDLDIRECT   Other studies Reviewed: Additional studies/ records that were reviewed today with results demonstrating: labs reviewed.  Home BP readings reviewed, LDL 94 in Jan 2022   ASSESSMENT AND PLAN:  CAD: No angina. Continue aggressive secondary prevention. No NTG use. Continue walking in effort to lose weight. Refill clopidogrel and SL NTG. HTN: Readings improved with adding chlorthalidone. He has lost some weight.  Should improve with additional weight  loss. Hyperlipidemia: LDL 94.  Continue lipid lowering therapy. COntinue statin.  Obesity: Some recent weight loss through diet and exercise.  Whole food, plant based diet.  PAD: no nonhealing sores.   May need repeat prostate biopsy. Can check home BP 2-3 x/week after relaxing for 5-10 minutes.    Current medicines are reviewed at length with the patient today.  The patient concerns regarding his medicines were addressed.  The following changes have been made:  No change  Labs/ tests ordered today include:  No orders of the defined types were  placed in this encounter.   Recommend 150 minutes/week of aerobic exercise Low fat, low carb, high fiber diet recommended  Disposition:   FU in 6 months   Signed, Larae Grooms, MD  05/06/2021 11:48 AM    [facility] [place], [place], [place]  [address] Phone: [phone]; Fax: [phone]

## 2021-05-06 ENCOUNTER — Encounter: Payer: Self-pay | Admitting: Interventional Cardiology

## 2021-05-06 ENCOUNTER — Other Ambulatory Visit: Payer: Self-pay

## 2021-05-06 ENCOUNTER — Ambulatory Visit: Payer: Medicare Other | Admitting: Interventional Cardiology

## 2021-05-06 VITALS — BP 138/72 | HR 68 | Ht 65.0 in | Wt 218.2 lb

## 2021-05-06 DIAGNOSIS — I739 Peripheral vascular disease, unspecified: Secondary | ICD-10-CM | POA: Diagnosis not present

## 2021-05-06 DIAGNOSIS — I25118 Atherosclerotic heart disease of native coronary artery with other forms of angina pectoris: Secondary | ICD-10-CM

## 2021-05-06 DIAGNOSIS — E782 Mixed hyperlipidemia: Secondary | ICD-10-CM

## 2021-05-06 DIAGNOSIS — I10 Essential (primary) hypertension: Secondary | ICD-10-CM | POA: Diagnosis not present

## 2021-05-06 MED ORDER — CLOPIDOGREL BISULFATE 75 MG PO TABS: 75.0000 mg | ORAL_TABLET | Freq: Every day | ORAL | 3 refills | Status: DC

## 2021-05-06 MED ORDER — NITROGLYCERIN 0.4 MG SL SUBL: 0.4000 mg | SUBLINGUAL_TABLET | SUBLINGUAL | 3 refills | Status: DC | PRN

## 2021-05-06 NOTE — Patient Instructions (Signed)
Medication Instructions:  Your physician recommends that you continue on your current medications as directed. Please refer to the Current Medication list given to you today.  Refills for clopidogrel (Plavix) and Nitroglycerin sent to pharmacy  *If you need a refill on your cardiac medications before your next appointment, please call your pharmacy*   Lab Work: NONE If you have labs (blood work) drawn today and your tests are completely normal, you will receive your results only by: [place] (if you have MyChart) OR A paper copy in the mail If you have any lab test that is abnormal or we need to change your treatment, we will call you to review the results.   Testing/Procedures: NONE   Follow-Up: At [facility], you and your health needs are our priority.  As part of our continuing mission to provide you with exceptional heart care, we have created designated Provider Care Teams.  These Care Teams include your primary Cardiologist (physician) and Advanced Practice Providers (APPs -  Physician Assistants and Nurse Practitioners) who all work together to provide you with the care you need, when you need it.    Your next appointment:   6 month(s)  The format for your next appointment:   In Person  Provider:   You may see Larae Grooms, MD or one of the following Advanced Practice Providers on your designated Care Team:   Melina Copa, PA-C Ermalinda Barrios, PA-C   Other Instructions

## 2021-05-07 NOTE — Addendum Note (Signed)
Addended by: Lynn Ito on: 05/07/2021 01:16 PM   Modules accepted: Orders

## 2021-06-03 DIAGNOSIS — C61 Malignant neoplasm of prostate: Secondary | ICD-10-CM | POA: Diagnosis not present

## 2021-06-10 DIAGNOSIS — C61 Malignant neoplasm of prostate: Secondary | ICD-10-CM | POA: Diagnosis not present

## 2021-06-10 DIAGNOSIS — N403 Nodular prostate with lower urinary tract symptoms: Secondary | ICD-10-CM | POA: Diagnosis not present

## 2021-06-10 DIAGNOSIS — R972 Elevated prostate specific antigen [PSA]: Secondary | ICD-10-CM | POA: Diagnosis not present

## 2021-06-10 DIAGNOSIS — R351 Nocturia: Secondary | ICD-10-CM | POA: Diagnosis not present

## 2021-06-11 ENCOUNTER — Telehealth: Payer: Self-pay | Admitting: Interventional Cardiology

## 2021-06-11 NOTE — Telephone Encounter (Signed)
   [facility] Pre-operative Risk Assessment    Patient Name: Paul Tyler  DOB: 08/21/1951 MRN: [id number]  HEARTCARE STAFF:  - IMPORTANT!!!!!! Under Visit Info/Reason for Call, type in Other and utilize the format Clearance MM/DD/YY or Clearance TBD. Do not use dashes or single digits. - Please review there is not already an duplicate clearance open for this procedure. - If request is for dental extraction, please clarify the # of teeth to be extracted. - If the patient is currently at the dentist's office, call Pre-Op Callback Staff (MA/nurse) to input urgent request.  - If the patient is not currently in the dentist office, please route to the Pre-Op pool.  Request for surgical clearance:  What type of surgery is being performed? Biopsy  When is this surgery scheduled? TBD  What type of clearance is required (medical clearance vs. Pharmacy clearance to hold med vs. Both)?   Are there any medications that need to be held  stop to surgery and how long? Plavix  5 days prior to the procedure  Practice name and name of physician performing surgery? Dr [name]  What is the office phone number? [phone]   7.   What is the office fax number? [phone]  8.   Anesthesia type (None, local, MAC, general) ?    Glyn Ade 06/11/2021, 1:52 PM  _________________________________________________________________   (provider comments below)

## 2021-06-11 NOTE — Telephone Encounter (Signed)
    Patient Name: Paul Tyler  DOB: Sep 25, 1950 MRN: [id number]  Primary Cardiologist: Larae Grooms, MD  Chart reviewed as part of pre-operative protocol coverage.  The patient was doing relatively well on cardiac standpoint when seen by Dr. Irish Lack May 06, 2021.  Dr. Irish Lack can patient hold Plavix for 5 days for biopsy?. Please forward your response to P CV DIV PREOP.   Thank you   Leanor Kail, PA 06/11/2021, 2:46 PM

## 2021-06-12 NOTE — Telephone Encounter (Signed)
OK to hold Plavix for biopsy for 5 days.

## 2021-06-12 NOTE — Telephone Encounter (Signed)
Left VM

## 2021-06-12 NOTE — Telephone Encounter (Signed)
   Name: AGUSTIN SWATEK  DOB: [birth year]  MRN: [id number]   Primary Cardiologist: Larae Grooms, MD  Chart reviewed as part of pre-operative protocol coverage. Patient was contacted 06/12/2021 in reference to pre-operative risk assessment for pending surgery as outlined below.  KRIST ROSENBOOM was last seen on 05/06/21 by Dr. Irish Lack.  Since that day, AMERE BRICCO has done well. He can complete more than 4.0 METS without angina (climbs 24 steps).   Per Dr. Ed Blalock: may hold plavix for 5 days prior to surgery.   Therefore, based on ACC/AHA guidelines, the patient would be at acceptable risk for the planned procedure without further cardiovascular testing.   The patient was advised that if he develops new symptoms prior to surgery to contact our office to arrange for a follow-up visit, and he verbalized understanding.  I will route this recommendation to the requesting party via Epic fax function and remove from pre-op pool. Please call with questions.  Tami Lin Sada Mazzoni, PA 06/12/2021, 3:01 PM

## 2021-06-24 DIAGNOSIS — C61 Malignant neoplasm of prostate: Secondary | ICD-10-CM | POA: Diagnosis not present

## 2021-07-02 ENCOUNTER — Telehealth: Payer: Self-pay

## 2021-07-02 NOTE — Telephone Encounter (Signed)
This is an [facility] patient that called here.

## 2021-07-02 NOTE — Telephone Encounter (Signed)
Patient called and advised he was returning a call from Dr. Jeffie Pollock or [facility] regarding his Biopsy last week. He advised he knew Dr. Jeffie Pollock was in our office on Thursday so he wanted to contact him here rather than [facility].

## 2021-07-17 DIAGNOSIS — Z23 Encounter for immunization: Secondary | ICD-10-CM | POA: Diagnosis not present

## 2021-07-30 DIAGNOSIS — Z23 Encounter for immunization: Secondary | ICD-10-CM | POA: Diagnosis not present

## 2021-07-31 DIAGNOSIS — R972 Elevated prostate specific antigen [PSA]: Secondary | ICD-10-CM | POA: Diagnosis not present

## 2021-07-31 DIAGNOSIS — C61 Malignant neoplasm of prostate: Secondary | ICD-10-CM | POA: Diagnosis not present

## 2021-08-23 ENCOUNTER — Other Ambulatory Visit: Payer: Self-pay | Admitting: Interventional Cardiology

## 2021-09-15 DIAGNOSIS — J019 Acute sinusitis, unspecified: Secondary | ICD-10-CM | POA: Diagnosis not present

## 2021-10-05 DIAGNOSIS — Z0000 Encounter for general adult medical examination without abnormal findings: Secondary | ICD-10-CM | POA: Diagnosis not present

## 2021-10-05 DIAGNOSIS — I2510 Atherosclerotic heart disease of native coronary artery without angina pectoris: Secondary | ICD-10-CM | POA: Diagnosis not present

## 2021-10-05 DIAGNOSIS — R7301 Impaired fasting glucose: Secondary | ICD-10-CM | POA: Diagnosis not present

## 2021-10-05 DIAGNOSIS — Z5181 Encounter for therapeutic drug level monitoring: Secondary | ICD-10-CM | POA: Diagnosis not present

## 2021-10-05 DIAGNOSIS — I10 Essential (primary) hypertension: Secondary | ICD-10-CM | POA: Diagnosis not present

## 2021-10-05 DIAGNOSIS — E782 Mixed hyperlipidemia: Secondary | ICD-10-CM | POA: Diagnosis not present

## 2021-11-10 ENCOUNTER — Ambulatory Visit: Payer: Medicare Other | Admitting: Interventional Cardiology

## 2021-11-18 DIAGNOSIS — C61 Malignant neoplasm of prostate: Secondary | ICD-10-CM | POA: Diagnosis not present

## 2021-11-25 DIAGNOSIS — C61 Malignant neoplasm of prostate: Secondary | ICD-10-CM | POA: Diagnosis not present

## 2021-11-25 DIAGNOSIS — R972 Elevated prostate specific antigen [PSA]: Secondary | ICD-10-CM | POA: Diagnosis not present

## 2021-11-25 DIAGNOSIS — N401 Enlarged prostate with lower urinary tract symptoms: Secondary | ICD-10-CM | POA: Diagnosis not present

## 2021-11-25 DIAGNOSIS — R351 Nocturia: Secondary | ICD-10-CM | POA: Diagnosis not present

## 2021-11-27 DIAGNOSIS — H524 Presbyopia: Secondary | ICD-10-CM | POA: Diagnosis not present

## 2021-12-10 DIAGNOSIS — U071 COVID-19: Secondary | ICD-10-CM | POA: Diagnosis not present

## 2021-12-21 DIAGNOSIS — Z20822 Contact with and (suspected) exposure to covid-19: Secondary | ICD-10-CM | POA: Diagnosis not present

## 2021-12-28 DIAGNOSIS — I739 Peripheral vascular disease, unspecified: Secondary | ICD-10-CM | POA: Diagnosis not present

## 2022-03-29 NOTE — Progress Notes (Unsigned)
Cardiology Office Note   Date:  03/31/2022   ID:  Paul Tyler, Paul Tyler [birth year], MRN [id number]  PCP:  Glenis Smoker, MD    No chief complaint on file.  CAD  Wt Readings from Last 3 Encounters:  03/31/22 207 lb (93.9 kg)  05/06/21 218 lb 3.2 oz (99 kg)  04/19/21 233 lb 11 oz (106 kg)       History of Present Illness: Paul Tyler is a 71 y.o. male  who had an LAD stent done in 8/13, 2.75 x 32 Promus DES, postdilated to 3.5 mm.    Before his stent, he had a chest pain with a distinctive sharpness, that would come on with exertion and resolve with rest.   He has been maintained on medical therapy since that time.     Evidence of right SFA disease in 2012.  Saw Dr. Oneida Alar.   He has had some intermittent chest discomfort, which resolved with medical therapy.    He was diagnosed with prostate cancer in early 2019.    Dr. Jeffie Pollock is following him and he is being observed.  He was off of the Plavix for the prostate biopsy.   He retired in Jan 2020.     In 06/2019, it was noted: "He has gained weight since that time.  He was drinking more beer.  Since the weight gain, he has had more fatigue and DOE.  He reports some numbness in his arms and legs if sleeps in certain positions. "   He had some increased shortness of breath and chemical stress test was done in late 2021 showing: "Nuclear stress EF: 72%. The left ventricular ejection fraction is hyperdynamic (>65%). There was no ST segment deviation noted during stress. This is a low risk study. There is no evidence of ischemia and no evidence of previous infarction. The study is normal."     BP was increased in 04/2021 and he went to the ER.  Chlorthalidone was added.  BPs decreased to the 017C systolic at home.    He has prostate cancer which gives some bladder issues.   Denies : Chest pain. Dizziness. Leg edema. Nitroglycerin use. Orthopnea. Palpitations. Paroxysmal nocturnal dyspnea. Shortness of breath.  Syncope.   After 40 yards, he gets some calf pain. He has seen vascular and surgery was offered many years ago but he declined.  He stopped smoking instead.  Uses some vaping device.  He has not seen vascular in many years.  No nonhealing sores.  Past Medical History:  Diagnosis Date   Adenomatous colon polyp    Anginal pain ([place])    Anxiety    Coronary artery disease    Hyperlipidemia    Hypertension    Impotence of organic origin    Insomnia, unspecified    Peripheral vascular disease (HCC)    right leg   Prostate cancer (HCC)    Psoriasis    PVD (peripheral vascular disease) (HCC)    Recurrent low back pain    SOB (shortness of breath) on exertion     Past Surgical History:  Procedure Laterality Date   CORONARY ANGIOPLASTY WITH STENT PLACEMENT  04/13/2012   MID LAD   LEFT HEART CATHETERIZATION WITH CORONARY ANGIOGRAM N/A 04/13/2012   Procedure: LEFT HEART CATHETERIZATION WITH CORONARY ANGIOGRAM;  Surgeon: Jettie Booze, MD;  Location: [facility] CATH LAB;  Service: Cardiovascular;  Laterality: N/A;   PROSTATE BIOPSY       Current Outpatient Medications  Medication  Sig Dispense Refill   ALPRAZolam (XANAX) 0.25 MG tablet Take 0.125-0.25 mg by mouth every 8 (eight) hours.     chlorthalidone (HYGROTON) 25 MG tablet Take 25 mg by mouth every morning.     Cholecalciferol (VITAMIN D) 2000 UNITS CAPS Take 1 capsule by mouth daily.      clopidogrel (PLAVIX) 75 MG tablet Take 1 tablet by mouth once daily 90 tablet 3   losartan (COZAAR) 100 MG tablet Take 1 tablet (100 mg total) by mouth daily. 90 tablet 3   nitroGLYCERIN (NITROSTAT) 0.4 MG SL tablet Place 1 tablet (0.4 mg total) under the tongue every 5 (five) minutes as needed. For chest pain 25 tablet 3   rosuvastatin (CRESTOR) 10 MG tablet Take 10 mg by mouth 4 (four) times a week.     tadalafil (CIALIS) 5 MG tablet Take 5 mg by mouth as needed.     No current facility-administered medications for this visit.    Allergies:    Lipitor [atorvastatin calcium], Zyban [bupropion hcl], and Zocor [simvastatin]    Social History:  The patient  reports that he quit smoking about 10 years ago. His smoking use included cigarettes. He has a 45.00 pack-year smoking history. He has never used smokeless tobacco. He reports current alcohol use of about 3.0 - 4.0 standard drinks of alcohol per week. He reports that he does not use drugs.   Family History:  The patient's family history includes Colon cancer in his maternal grandfather; Diabetes in his father and mother; Heart disease in his father and mother; Prostate cancer in his father, maternal uncle, and maternal uncle; Stroke in his mother.    ROS:  Please see the history of present illness.   Otherwise, review of systems are positive for leg pain with walking.   All other systems are reviewed and negative.    PHYSICAL EXAM: VS:  BP 120/60 (BP Location: Left Arm, Patient Position: Sitting, Cuff Size: Normal)   Pulse (!) 57   Ht '5\' 5"'$  (1.651 m)   Wt 207 lb (93.9 kg)   SpO2 94%   BMI 34.45 kg/m  , BMI Body mass index is 34.45 kg/m. GEN: Well nourished, well developed, in no acute distress HEENT: normal Neck: no JVD, carotid bruits, or masses Cardiac: RRR; no murmurs, rubs, or gallops,no edema  Respiratory:  clear to auscultation bilaterally, normal work of breathing GI: soft, nontender, nondistended, + BS MS: no deformity or atrophy Skin: warm and dry, no rash Neuro:  Strength and sensation are intact Psych: euthymic mood, full affect   EKG:   The ekg ordered today demonstrates NSR, IRBBB, no ST changes   Recent Labs: 04/19/2021: BUN 16; Creatinine, Ser 0.94; Hemoglobin 15.0; Platelets 241; Potassium 4.7; Sodium 138   Lipid Panel No results found for: "CHOL", "TRIG", "HDL", "CHOLHDL", "VLDL", "LDLCALC", "LDLDIRECT"   Other studies Reviewed: Additional studies/ records that were reviewed today with results demonstrating: labs reviewed.   ASSESSMENT AND  PLAN:  CAD: Continue aggressive secondary prevention.  Has some easy bruising with clopidogrel monotherapy.  Given his PAD and coronary artery disease with prior PCI, I think it is worthwhile staying on this medicine.  No severe bleeding.  Hemoglobin in January 2023 was 13.9. Hypertension: Low-salt diet. Hyperlipidemia: Avoid processed foods.  High-fiber diet.  LDL 84.  He has been tolerating his 4 days a week statin. Obesity: Whole food, plant-based diet.  Hopefully, if his PAD can be treated, he can be more active. PAD: Has had claudication  in the past but no critical limb ischemia.  Discussed LE arterial Dopplers.  He is agreeable.  I did discuss that if there is severe disease, would refer to one of our vascular doctors.   Current medicines are reviewed at length with the patient today.  The patient concerns regarding his medicines were addressed.  The following changes have been made:  No change  Labs/ tests ordered today include: LE Dopplers No orders of the defined types were placed in this encounter.   Recommend 150 minutes/week of aerobic exercise Low fat, low carb, high fiber diet recommended  Disposition:   FU in 1 year   Signed, Larae Grooms, MD  03/31/2022 9:54 AM    [facility] [place], [place], [place]  [address] Phone: [phone]; Fax: [phone]

## 2022-03-31 ENCOUNTER — Ambulatory Visit: Payer: Medicare Other | Admitting: Interventional Cardiology

## 2022-03-31 ENCOUNTER — Encounter: Payer: Self-pay | Admitting: Interventional Cardiology

## 2022-03-31 VITALS — BP 120/60 | HR 57 | Ht 65.0 in | Wt 207.0 lb

## 2022-03-31 DIAGNOSIS — I25118 Atherosclerotic heart disease of native coronary artery with other forms of angina pectoris: Secondary | ICD-10-CM

## 2022-03-31 DIAGNOSIS — E782 Mixed hyperlipidemia: Secondary | ICD-10-CM | POA: Diagnosis not present

## 2022-03-31 DIAGNOSIS — I739 Peripheral vascular disease, unspecified: Secondary | ICD-10-CM

## 2022-03-31 DIAGNOSIS — R0609 Other forms of dyspnea: Secondary | ICD-10-CM

## 2022-03-31 DIAGNOSIS — I10 Essential (primary) hypertension: Secondary | ICD-10-CM | POA: Diagnosis not present

## 2022-03-31 NOTE — Patient Instructions (Signed)
Medication Instructions:  Your physician recommends that you continue on your current medications as directed. Please refer to the Current Medication list given to you today.  *If you need a refill on your cardiac medications before your next appointment, please call your pharmacy*   Lab Work: none If you have labs (blood work) drawn today and your tests are completely normal, you will receive your results only by: [place] (if you have MyChart) OR A paper copy in the mail If you have any lab test that is abnormal or we need to change your treatment, we will call you to review the results.   Testing/Procedures: Your physician has requested that you have a lower or upper extremity arterial duplex. This test is an ultrasound of the arteries in the legs or arms. It looks at arterial blood flow in the legs and arms. Allow one hour for Lower and Upper Arterial scans. There are no restrictions or special instructions    Follow-Up: At [facility], you and your health needs are our priority.  As part of our continuing mission to provide you with exceptional heart care, we have created designated Provider Care Teams.  These Care Teams include your primary Cardiologist (physician) and Advanced Practice Providers (APPs -  Physician Assistants and Nurse Practitioners) who all work together to provide you with the care you need, when you need it.  We recommend signing up for the patient portal called "MyChart".  Sign up information is provided on this After Visit Summary.  MyChart is used to connect with patients for Virtual Visits (Telemedicine).  Patients are able to view lab/test results, encounter notes, upcoming appointments, etc.  Non-urgent messages can be sent to your provider as well.   To learn more about what you can do with MyChart, go to [email or web address].    Your next appointment:   12 month(s)  The format for your next appointment:   In Person  Provider:   Larae Grooms, MD     Other Instructions    Important Information About Sugar

## 2022-03-31 NOTE — Addendum Note (Signed)
Addended by: Thompson Grayer on: 03/31/2022 10:51 AM   Modules accepted: Orders

## 2022-04-02 DIAGNOSIS — I10 Essential (primary) hypertension: Secondary | ICD-10-CM | POA: Diagnosis not present

## 2022-04-02 DIAGNOSIS — I2510 Atherosclerotic heart disease of native coronary artery without angina pectoris: Secondary | ICD-10-CM | POA: Diagnosis not present

## 2022-04-02 DIAGNOSIS — F419 Anxiety disorder, unspecified: Secondary | ICD-10-CM | POA: Diagnosis not present

## 2022-04-16 ENCOUNTER — Other Ambulatory Visit: Payer: Self-pay | Admitting: Interventional Cardiology

## 2022-04-16 ENCOUNTER — Ambulatory Visit (HOSPITAL_COMMUNITY)
Admission: RE | Admit: 2022-04-16 | Discharge: 2022-04-16 | Disposition: A | Discharge status: Home / Self Care | Payer: Medicare Other | Source: Ambulatory Visit | Attending: Cardiology | Admitting: Cardiology

## 2022-04-16 DIAGNOSIS — I739 Peripheral vascular disease, unspecified: Secondary | ICD-10-CM | POA: Diagnosis not present

## 2022-04-18 ENCOUNTER — Other Ambulatory Visit: Payer: Self-pay | Admitting: Interventional Cardiology

## 2022-04-23 ENCOUNTER — Other Ambulatory Visit: Payer: Self-pay | Admitting: Interventional Cardiology

## 2022-04-23 ENCOUNTER — Telehealth: Payer: Self-pay | Admitting: Interventional Cardiology

## 2022-04-23 NOTE — Telephone Encounter (Signed)
Pt called wanting results of his vascular US of ABI, and vascular US of lower extremities. These imaging results have not been reviewed by the provider, and it is currently unknown what changes in the plan of care will be?  Pt understood, and told him we will f/u with instruction in the near future.  Pt reports feeling ok, and no new symptoms to report at this time.

## 2022-04-23 NOTE — Telephone Encounter (Signed)
Patient called to follow-up on his MRI results.

## 2022-04-23 NOTE — Telephone Encounter (Signed)
*  STAT* If patient is at the pharmacy, call can be transferred to refill team.   1. Which medications need to be refilled? (please list name of each medication and dose if known)   losartan (COZAAR) 100 MG tablet  2. Which pharmacy/location (including street and city if local pharmacy) is medication to be sent to?  [place] ([place]),  - [address]  3. Do they need a 30 day or 90 day supply? 90 day  Patient stated he has 3 days medication left.

## 2022-04-30 ENCOUNTER — Telehealth: Payer: Self-pay | Admitting: Interventional Cardiology

## 2022-04-30 DIAGNOSIS — I739 Peripheral vascular disease, unspecified: Secondary | ICD-10-CM

## 2022-04-30 NOTE — Telephone Encounter (Signed)
Patient was calling back for update on the referral. Please advise

## 2022-04-30 NOTE — Telephone Encounter (Signed)
Pt reports would like to know what the next steps are relating to BLE arterial duplex study from 04/16/22.  Per report pt needs vascular referral.  However, Dr.Varanasi has not resulted study.  Advised pt will route to MD to follow up.

## 2022-05-03 NOTE — Telephone Encounter (Signed)
Message  Refer to Dr. Fletcher Anon as he not seen Dr. Oneida Alar for quite some time.     JV  -----     Pt aware that per Dr. Irish Lack, he needs to be referred to Dr. Fletcher Anon in [facility], being he has not seen Dr. Oneida Alar for quite sometime.  Pt aware I will place the referral in the system and send a message to our [facility] Schedulers to call him back and arrange this appt.  Pt verbalized understanding and agrees with this plan.

## 2022-05-03 NOTE — Telephone Encounter (Signed)
RE: needs to be referred to Dr. Fletcher Anon for [place] per Dr. Irish Lack Received: Today Jerlyn Ly, LPN; Thompson Grayer, RN 05-25-22 @ 11:00 PT IS AWARE

## 2022-05-25 ENCOUNTER — Ambulatory Visit: Payer: Medicare Other | Attending: Cardiovascular Disease | Admitting: Cardiovascular Disease

## 2022-05-25 ENCOUNTER — Encounter: Payer: Self-pay | Admitting: Cardiovascular Disease

## 2022-05-25 VITALS — BP 130/72 | HR 79 | Ht 64.0 in | Wt 207.2 lb

## 2022-05-25 DIAGNOSIS — I2510 Atherosclerotic heart disease of native coronary artery without angina pectoris: Secondary | ICD-10-CM

## 2022-05-25 DIAGNOSIS — I10 Essential (primary) hypertension: Secondary | ICD-10-CM | POA: Diagnosis not present

## 2022-05-25 DIAGNOSIS — I739 Peripheral vascular disease, unspecified: Secondary | ICD-10-CM | POA: Diagnosis not present

## 2022-05-25 DIAGNOSIS — Z01812 Encounter for preprocedural laboratory examination: Secondary | ICD-10-CM

## 2022-05-25 DIAGNOSIS — E785 Hyperlipidemia, unspecified: Secondary | ICD-10-CM

## 2022-05-25 NOTE — Patient Instructions (Signed)
Medication Instructions:  Your physician recommends that you continue on your current medications as directed. Please refer to the Current Medication list given to you today.    Lab Work: TODAY: BMET, CBC   Testing/Procedures:  [place] A DEPT OF [place] A DEPT OF [place]. [facility] [place] [id number] [place] [id number] Dept: [phone] Loc: [place]  05/25/2022  You are scheduled for a Peripheral Angiogram on Wednesday, September 27 with Dr. Kathlyn Sacramento.  1. Please arrive at the [facility] (Main Entrance A) at [facility]: [address] [place], [place] [address] at 8:30 AM (This time is two hours before your procedure to ensure your preparation). Free valet parking service is available.   Special note: Every effort is made to have your procedure done on time. Please understand that emergencies sometimes delay scheduled procedures.  2. Diet: Do not eat solid foods after midnight.  The patient may have clear liquids until 5am upon the day of the procedure.  3. Labs: You will need to have blood drawn on TODAY  4. Medication instructions in preparation for your procedure:   Contrast Allergy: No  On the morning of your procedure, take your Aspirin and any morning medicines NOT listed above.  You may use sips of water.  5. Plan for one night stay--bring personal belongings. 6. Bring a current list of your medications and current insurance cards. 7. You MUST have a responsible person to drive you home. 8. Someone MUST be with you the first 24 hours after you arrive home or your discharge will be delayed. 9. Please wear clothes that are easy to get on and off and wear slip-on shoes.  Thank you for allowing [place] to care for you!   -- [facility]    Follow-Up: At [facility], you and your health  needs are our priority.  As part of our continuing mission to provide you with exceptional heart care, we have created designated Provider Care Teams.  These Care Teams include your primary Cardiologist (physician) and Advanced Practice Providers (APPs -  Physician Assistants and Nurse Practitioners) who all work together to provide you with the care you need, when you need it.  We recommend signing up for the patient portal called "MyChart".  Sign up information is provided on this After Visit Summary.  MyChart is used to connect with patients for Virtual Visits (Telemedicine).  Patients are able to view lab/test results, encounter notes, upcoming appointments, etc.  Non-urgent messages can be sent to your provider as well.   To learn more about what you can do with MyChart, go to [email or web address].    Your next appointment:   1 month(s)  The format for your next appointment:   In Person  Provider:   Kathlyn Sacramento, MD   Other Instructions   Important Information About [place]

## 2022-05-25 NOTE — H&P (View-Only) (Signed)
Cardiology Office Note   Date:  05/31/2022   ID:  Paul, Tyler Apr 08, 1951, MRN [id number]  PCP:  Glenis Smoker, MD  Cardiologist:  Dr. Livingston Diones  No chief complaint on file.     History of Present Illness: Paul Tyler is a 71 y.o. male who was referred by Dr. Irish Lack for evaluation and management of peripheral arterial disease. He has known history of coronary artery disease status post PCI, peripheral arterial disease, essential hypertension, prostate cancer, hyperlipidemia and chronic low back pain. He was seen by Dr. Oneida Alar in 2012 for right lower extremity claudication.  His ABI at that time was 0.48 on the right and normal on the left.  His symptoms are not lifestyle limiting and thus he was treated medically. Over the last 6 to 12 months, the patient had significant worsening of bilateral leg pain especially on the right side.  The pain is usually in both calfs and happens after walking less than 50 feet.  In addition, he has significant numbness in the right foot at night when he is trying to sleep and frequently he wakes up to dangle his legs down the bed  He has known history of prostate cancer currently followed by urology.  He is not diabetic and quit smoking 6 years ago.  He does use e-cigarettes.  He underwent noninvasive vascular studies which showed an ABI of 0.40 on the right and 0.37 on the left with absent toe pressure.  Duplex showed severe right common iliac artery stenosis and longer occlusion of the right SFA.  On the left side, the external iliac artery was occluded with longer occlusion of the SFA.   Past Medical History:  Diagnosis Date   Adenomatous colon polyp    Anginal pain ([place])    Anxiety    Coronary artery disease    Hyperlipidemia    Hypertension    Impotence of organic origin    Insomnia, unspecified    Peripheral vascular disease (HCC)    right leg   Prostate cancer (HCC)    Psoriasis    PVD (peripheral vascular  disease) (HCC)    Recurrent low back pain    SOB (shortness of breath) on exertion     Past Surgical History:  Procedure Laterality Date   CORONARY ANGIOPLASTY WITH STENT PLACEMENT  04/13/2012   MID LAD   LEFT HEART CATHETERIZATION WITH CORONARY ANGIOGRAM N/A 04/13/2012   Procedure: LEFT HEART CATHETERIZATION WITH CORONARY ANGIOGRAM;  Surgeon: Jettie Booze, MD;  Location: [facility] CATH LAB;  Service: Cardiovascular;  Laterality: N/A;   PROSTATE BIOPSY       Current Outpatient Medications  Medication Sig Dispense Refill   ALPRAZolam (XANAX) 0.25 MG tablet Take 0.125-0.25 mg by mouth every 8 (eight) hours.     chlorthalidone (HYGROTON) 25 MG tablet Take 25 mg by mouth every morning.     Cholecalciferol (VITAMIN D) 2000 UNITS CAPS Take 1 capsule by mouth daily.      clopidogrel (PLAVIX) 75 MG tablet Take 1 tablet by mouth once daily 90 tablet 3   losartan (COZAAR) 100 MG tablet Take 1 tablet by mouth once daily 90 tablet 0   nitroGLYCERIN (NITROSTAT) 0.4 MG SL tablet PLACE ONE TABLET UNDER THE TONGUE EVERY 5 MINUTES AS NEEDED FOR CHEST PAIN. 25 tablet 3   rosuvastatin (CRESTOR) 10 MG tablet Take 10 mg by mouth 4 (four) times a week.     tadalafil (CIALIS) 5 MG tablet Take 5 mg  by mouth as needed.     Vitamin D, Ergocalciferol, (DRISDOL) 1.25 MG (50000 UNIT) CAPS capsule Take 50,000 Units by mouth every 7 (seven) days.     No current facility-administered medications for this visit.    Allergies:   Lipitor [atorvastatin calcium], Zyban [bupropion hcl], and Zocor [simvastatin]    Social History:  The patient  reports that he quit smoking about 11 years ago. His smoking use included cigarettes. He has a 45.00 pack-year smoking history. He has never used smokeless tobacco. He reports current alcohol use of about 3.0 - 4.0 standard drinks of alcohol per week. He reports that he does not use drugs.   Family History:  The patient's family history includes Colon cancer in his maternal  grandfather; Diabetes in his father and mother; Heart disease in his father and mother; Prostate cancer in his father, maternal uncle, and maternal uncle; Stroke in his mother.    ROS:  Please see the history of present illness.   Otherwise, review of systems are positive for none.   All other systems are reviewed and negative.    PHYSICAL EXAM: VS:  BP 130/72   Pulse 79   Ht '5\' 4"'$  (1.626 m)   Wt 207 lb 3.2 oz (94 kg)   SpO2 97%   BMI 35.57 kg/m  , BMI Body mass index is 35.57 kg/m. GEN: Well nourished, well developed, in no acute distress  HEENT: normal  Neck: no JVD, carotid bruits, or masses Cardiac: RRR; no murmurs, rubs, or gallops,no edema  Respiratory:  clear to auscultation bilaterally, normal work of breathing GI: soft, nontender, nondistended, + BS MS: no deformity or atrophy  Skin: warm and dry, no rash Neuro:  Strength and sensation are intact Psych: euthymic mood, full affect Vascular: Femoral pulses is absent bilaterally.  Distal pulses are not palpable.  Radial pulses +2 bilaterally.   EKG:  EKG is not ordered today.    Recent Labs: 05/25/2022: BUN 24; Creatinine, Ser 1.25; Hemoglobin 14.0; Platelets 234; Potassium 4.8; Sodium 145    Lipid Panel No results found for: "CHOL", "TRIG", "HDL", "CHOLHDL", "VLDL", "LDLCALC", "LDLDIRECT"    Wt Readings from Last 3 Encounters:  05/25/22 207 lb 3.2 oz (94 kg)  03/31/22 207 lb (93.9 kg)  05/06/21 218 lb 3.2 oz (99 kg)           No data to display            ASSESSMENT AND PLAN:  1.  Peripheral arterial disease: Severe claudication as well as rest pain due to multilevel disease including iliac and SFA disease.  His symptoms are clearly lifestyle limiting.  In addition, he has recent rest pain.  No lower extremity ulceration.  Due to severity of his symptoms, I recommend proceeding with abdominal aortogram with lower extremity angiography and possible endovascular intervention.  I discussed the procedure  in details as well as risks and benefits.  Planned access is via the right common femoral artery.  He is already on clopidogrel.  2.  Coronary artery disease involving native coronary arteries without angina: Symptoms are stable overall.  3.  Essential hypertension: Blood pressure is reasonably controlled.  4.  Hyperlipidemia: Currently on rosuvastatin 10 mg 4 times a week with known history of statin induced myalgia.  I reviewed his labs done in January which showed an LDL of 84. Consider adding ezetimibe or PCSK9 inhibitor.   Disposition:   Proceed with angiography and follow-up after.  Signed,  Kathlyn Sacramento, MD  05/31/2022 4:24 PM    [facility]

## 2022-05-25 NOTE — Progress Notes (Signed)
Cardiology Office Note   Date:  05/31/2022   ID:  Amedio, Bowlby 10-23-1950, MRN [id number]  PCP:  Glenis Smoker, MD  Cardiologist:  Dr. Livingston Diones  No chief complaint on file.     History of Present Illness: Paul Tyler is a 71 y.o. male who was referred by Dr. Irish Lack for evaluation and management of peripheral arterial disease. He has known history of coronary artery disease status post PCI, peripheral arterial disease, essential hypertension, prostate cancer, hyperlipidemia and chronic low back pain. He was seen by Dr. Oneida Alar in 2012 for right lower extremity claudication.  His ABI at that time was 0.48 on the right and normal on the left.  His symptoms are not lifestyle limiting and thus he was treated medically. Over the last 6 to 12 months, the patient had significant worsening of bilateral leg pain especially on the right side.  The pain is usually in both calfs and happens after walking less than 50 feet.  In addition, he has significant numbness in the right foot at night when he is trying to sleep and frequently he wakes up to dangle his legs down the bed  He has known history of prostate cancer currently followed by urology.  He is not diabetic and quit smoking 6 years ago.  He does use e-cigarettes.  He underwent noninvasive vascular studies which showed an ABI of 0.40 on the right and 0.37 on the left with absent toe pressure.  Duplex showed severe right common iliac artery stenosis and longer occlusion of the right SFA.  On the left side, the external iliac artery was occluded with longer occlusion of the SFA.   Past Medical History:  Diagnosis Date   Adenomatous colon polyp    Anginal pain ([place])    Anxiety    Coronary artery disease    Hyperlipidemia    Hypertension    Impotence of organic origin    Insomnia, unspecified    Peripheral vascular disease (HCC)    right leg   Prostate cancer (HCC)    Psoriasis    PVD (peripheral vascular  disease) (HCC)    Recurrent low back pain    SOB (shortness of breath) on exertion     Past Surgical History:  Procedure Laterality Date   CORONARY ANGIOPLASTY WITH STENT PLACEMENT  04/13/2012   MID LAD   LEFT HEART CATHETERIZATION WITH CORONARY ANGIOGRAM N/A 04/13/2012   Procedure: LEFT HEART CATHETERIZATION WITH CORONARY ANGIOGRAM;  Surgeon: Jettie Booze, MD;  Location: [facility] CATH LAB;  Service: Cardiovascular;  Laterality: N/A;   PROSTATE BIOPSY       Current Outpatient Medications  Medication Sig Dispense Refill   ALPRAZolam (XANAX) 0.25 MG tablet Take 0.125-0.25 mg by mouth every 8 (eight) hours.     chlorthalidone (HYGROTON) 25 MG tablet Take 25 mg by mouth every morning.     Cholecalciferol (VITAMIN D) 2000 UNITS CAPS Take 1 capsule by mouth daily.      clopidogrel (PLAVIX) 75 MG tablet Take 1 tablet by mouth once daily 90 tablet 3   losartan (COZAAR) 100 MG tablet Take 1 tablet by mouth once daily 90 tablet 0   nitroGLYCERIN (NITROSTAT) 0.4 MG SL tablet PLACE ONE TABLET UNDER THE TONGUE EVERY 5 MINUTES AS NEEDED FOR CHEST PAIN. 25 tablet 3   rosuvastatin (CRESTOR) 10 MG tablet Take 10 mg by mouth 4 (four) times a week.     tadalafil (CIALIS) 5 MG tablet Take 5 mg  by mouth as needed.     Vitamin D, Ergocalciferol, (DRISDOL) 1.25 MG (50000 UNIT) CAPS capsule Take 50,000 Units by mouth every 7 (seven) days.     No current facility-administered medications for this visit.    Allergies:   Lipitor [atorvastatin calcium], Zyban [bupropion hcl], and Zocor [simvastatin]    Social History:  The patient  reports that he quit smoking about 11 years ago. His smoking use included cigarettes. He has a 45.00 pack-year smoking history. He has never used smokeless tobacco. He reports current alcohol use of about 3.0 - 4.0 standard drinks of alcohol per week. He reports that he does not use drugs.   Family History:  The patient's family history includes Colon cancer in his maternal  grandfather; Diabetes in his father and mother; Heart disease in his father and mother; Prostate cancer in his father, maternal uncle, and maternal uncle; Stroke in his mother.    ROS:  Please see the history of present illness.   Otherwise, review of systems are positive for none.   All other systems are reviewed and negative.    PHYSICAL EXAM: VS:  BP 130/72   Pulse 79   Ht '5\' 4"'$  (1.626 m)   Wt 207 lb 3.2 oz (94 kg)   SpO2 97%   BMI 35.57 kg/m  , BMI Body mass index is 35.57 kg/m. GEN: Well nourished, well developed, in no acute distress  HEENT: normal  Neck: no JVD, carotid bruits, or masses Cardiac: RRR; no murmurs, rubs, or gallops,no edema  Respiratory:  clear to auscultation bilaterally, normal work of breathing GI: soft, nontender, nondistended, + BS MS: no deformity or atrophy  Skin: warm and dry, no rash Neuro:  Strength and sensation are intact Psych: euthymic mood, full affect Vascular: Femoral pulses is absent bilaterally.  Distal pulses are not palpable.  Radial pulses +2 bilaterally.   EKG:  EKG is not ordered today.    Recent Labs: 05/25/2022: BUN 24; Creatinine, Ser 1.25; Hemoglobin 14.0; Platelets 234; Potassium 4.8; Sodium 145    Lipid Panel No results found for: "CHOL", "TRIG", "HDL", "CHOLHDL", "VLDL", "LDLCALC", "LDLDIRECT"    Wt Readings from Last 3 Encounters:  05/25/22 207 lb 3.2 oz (94 kg)  03/31/22 207 lb (93.9 kg)  05/06/21 218 lb 3.2 oz (99 kg)           No data to display            ASSESSMENT AND PLAN:  1.  Peripheral arterial disease: Severe claudication as well as rest pain due to multilevel disease including iliac and SFA disease.  His symptoms are clearly lifestyle limiting.  In addition, he has recent rest pain.  No lower extremity ulceration.  Due to severity of his symptoms, I recommend proceeding with abdominal aortogram with lower extremity angiography and possible endovascular intervention.  I discussed the procedure  in details as well as risks and benefits.  Planned access is via the right common femoral artery.  He is already on clopidogrel.  2.  Coronary artery disease involving native coronary arteries without angina: Symptoms are stable overall.  3.  Essential hypertension: Blood pressure is reasonably controlled.  4.  Hyperlipidemia: Currently on rosuvastatin 10 mg 4 times a week with known history of statin induced myalgia.  I reviewed his labs done in January which showed an LDL of 84. Consider adding ezetimibe or PCSK9 inhibitor.   Disposition:   Proceed with angiography and follow-up after.  Signed,  Kathlyn Sacramento, MD  05/31/2022 4:24 PM    [facility]

## 2022-05-26 LAB — BASIC METABOLIC PANEL
BUN/Creatinine Ratio: 19 (ref 10–24)
BUN: 24 mg/dL (ref 8–27)
CO2: 28 mmol/L (ref 20–29)
Calcium: 10 mg/dL (ref 8.6–10.2)
Chloride: 102 mmol/L (ref 96–106)
Creatinine, Ser: 1.25 mg/dL (ref 0.76–1.27)
Glucose: 108 mg/dL — ABNORMAL HIGH (ref 70–99)
Potassium: 4.8 mmol/L (ref 3.5–5.2)
Sodium: 145 mmol/L — ABNORMAL HIGH (ref 134–144)
eGFR: 62 mL/min/{1.73_m2} (ref >59)

## 2022-05-26 LAB — CBC WITH DIFFERENTIAL/PLATELET
Basophils Absolute: 0.1 10*3/uL (ref 0.0–0.2)
Basos: 1 %
EOS (ABSOLUTE): 0.2 10*3/uL (ref 0.0–0.4)
Eos: 2 %
Hematocrit: 40.5 % (ref 37.5–51.0)
Hemoglobin: 14 g/dL (ref 13.0–17.7)
Immature Grans (Abs): 0 10*3/uL (ref 0.0–0.1)
Immature Granulocytes: 0 %
Lymphocytes Absolute: 2 10*3/uL (ref 0.7–3.1)
Lymphs: 20 %
MCH: 31.8 pg (ref 26.6–33.0)
MCHC: 34.6 g/dL (ref 31.5–35.7)
MCV: 92 fL (ref 79–97)
Monocytes Absolute: 0.6 10*3/uL (ref 0.1–0.9)
Monocytes: 6 %
Neutrophils Absolute: 6.9 10*3/uL (ref 1.4–7.0)
Neutrophils: 71 %
Platelets: 234 10*3/uL (ref 150–450)
RBC: 4.4 x10E6/uL (ref 4.14–5.80)
RDW: 12.6 % (ref 11.6–15.4)
WBC: 9.7 10*3/uL (ref 3.4–10.8)

## 2022-06-01 ENCOUNTER — Telehealth: Payer: Self-pay | Admitting: Cardiovascular Disease

## 2022-06-01 NOTE — Telephone Encounter (Signed)
Returned the call to patient to discuss the Plavix. He has been educated on what Plavix is and the role in the procedure. He has been advised to not hold it.  He also stated that he has an appointment with Dr. Roni Bread at [facility] on Oct 23rd. They may be doing a biopsy for prostate cancer. He wants to make sure that this procedure will not effect that biopsy. He has been advised that he should be okay but will send the message to Dr. Fletcher Anon to be sure.

## 2022-06-01 NOTE — Telephone Encounter (Signed)
Patient states he has concerns with 9/27 procedure instructions. In the past, when he had a stent placed by Dr. Irish Lack he was advised to hold his blood thinner 5 days prior. He read that he may need to stay overnight due to excess bleeding and he assumes the blood thinner may cause additional bleeding.  He also mentions his urologist will be completing a biopsy in the near future--would like to ensure upcoming procedure with Dr. Fletcher Anon will not interfere with potential biopsy. Please advise.

## 2022-06-03 NOTE — Telephone Encounter (Signed)
I tried calling the patient and also tried calling his wife twice but no answer. If I place a stent during the upcoming angiogram, then he cannot stop Plavix for at least 1 month from the time of placement.

## 2022-06-04 NOTE — Telephone Encounter (Signed)
Spoke with the patient. He stated that he will just be having labs and a consultation in October. Any possible procedure would not be done until November or December.

## 2022-06-08 ENCOUNTER — Telehealth: Payer: Self-pay | Admitting: *Deleted

## 2022-06-08 NOTE — Telephone Encounter (Signed)
Abdominal aortogram scheduled at [facility] for: Wednesday June 09, 2022 10:30 AM Arrival time and place: [place] Entrance A at: 8:30 AM  Nothing to eat after midnight prior to procedure, clear liquids until 5 AM day of procedure.  Medication instructions: -Hold:  Chlorthalidone-AM of procedure  -Except hold medications usual morning medications can be taken with sips of water including aspirin 81 mg and Plavix 75 mg.  Confirmed patient has responsible adult to drive home post procedure and be with patient first 24 hours after arriving home.  Patient reports no new symptoms concerning for COVID-19 in the past 10 days.  Reviewed procedure instructions with patient.

## 2022-06-09 ENCOUNTER — Other Ambulatory Visit: Payer: Self-pay

## 2022-06-09 ENCOUNTER — Encounter (HOSPITAL_COMMUNITY)
Admission: RE | Disposition: A | Discharge status: Home / Self Care | Payer: Self-pay | Source: Home / Self Care | Attending: Cardiovascular Disease

## 2022-06-09 ENCOUNTER — Ambulatory Visit (HOSPITAL_COMMUNITY)
Admission: RE | Admit: 2022-06-09 | Discharge: 2022-06-09 | Disposition: A | Discharge status: Home / Self Care | Payer: Medicare Other | Attending: Cardiovascular Disease | Admitting: Cardiovascular Disease

## 2022-06-09 DIAGNOSIS — G8929 Other chronic pain: Secondary | ICD-10-CM | POA: Diagnosis not present

## 2022-06-09 DIAGNOSIS — Z79899 Other long term (current) drug therapy: Secondary | ICD-10-CM | POA: Diagnosis not present

## 2022-06-09 DIAGNOSIS — E785 Hyperlipidemia, unspecified: Secondary | ICD-10-CM | POA: Diagnosis not present

## 2022-06-09 DIAGNOSIS — I708 Atherosclerosis of other arteries: Secondary | ICD-10-CM | POA: Insufficient documentation

## 2022-06-09 DIAGNOSIS — I739 Peripheral vascular disease, unspecified: Secondary | ICD-10-CM | POA: Diagnosis not present

## 2022-06-09 DIAGNOSIS — I10 Essential (primary) hypertension: Secondary | ICD-10-CM | POA: Insufficient documentation

## 2022-06-09 DIAGNOSIS — I2510 Atherosclerotic heart disease of native coronary artery without angina pectoris: Secondary | ICD-10-CM | POA: Insufficient documentation

## 2022-06-09 DIAGNOSIS — Z87891 Personal history of nicotine dependence: Secondary | ICD-10-CM | POA: Diagnosis not present

## 2022-06-09 DIAGNOSIS — Z8546 Personal history of malignant neoplasm of prostate: Secondary | ICD-10-CM | POA: Diagnosis not present

## 2022-06-09 HISTORY — PX: ABDOMINAL AORTOGRAM W/LOWER EXTREMITY: CATH118223

## 2022-06-09 SURGERY — ABDOMINAL AORTOGRAM W/LOWER EXTREMITY
Anesthesia: LOCAL

## 2022-06-09 MED ORDER — FENTANYL CITRATE (PF) 100 MCG/2ML IJ SOLN
INTRAMUSCULAR | Status: DC | PRN
  Administered 2022-06-09 (×2): 50 ug via INTRAVENOUS

## 2022-06-09 MED ORDER — MIDAZOLAM HCL 2 MG/2ML IJ SOLN
INTRAMUSCULAR | Status: AC
  Filled 2022-06-09: qty 2

## 2022-06-09 MED ORDER — SODIUM CHLORIDE 0.9% FLUSH: 3.0000 mL | Freq: Two times a day (BID) | INTRAVENOUS | Status: DC

## 2022-06-09 MED ORDER — SODIUM CHLORIDE 0.9 % IV SOLN: INTRAVENOUS | Status: AC

## 2022-06-09 MED ORDER — HEPARIN (PORCINE) IN NACL 1000-0.9 UT/500ML-% IV SOLN
INTRAVENOUS | Status: DC | PRN
  Administered 2022-06-09 (×2): 500 mL

## 2022-06-09 MED ORDER — SODIUM CHLORIDE 0.9 % WEIGHT BASED INFUSION
3.0000 mL/kg/h | INTRAVENOUS | Status: AC
  Administered 2022-06-09: 3 mL/kg/h via INTRAVENOUS

## 2022-06-09 MED ORDER — ASPIRIN 81 MG PO CHEW: 81.0000 mg | CHEWABLE_TABLET | ORAL | Status: DC

## 2022-06-09 MED ORDER — IODIXANOL 320 MG/ML IV SOLN
INTRAVENOUS | Status: DC | PRN
  Administered 2022-06-09: 150 mL via INTRA_ARTERIAL

## 2022-06-09 MED ORDER — SODIUM CHLORIDE 0.9% FLUSH: 3.0000 mL | INTRAVENOUS | Status: DC | PRN

## 2022-06-09 MED ORDER — FENTANYL CITRATE (PF) 100 MCG/2ML IJ SOLN
INTRAMUSCULAR | Status: AC
  Filled 2022-06-09: qty 2

## 2022-06-09 MED ORDER — SODIUM CHLORIDE 0.9 % IV SOLN: 250.0000 mL | INTRAVENOUS | Status: DC | PRN

## 2022-06-09 MED ORDER — MIDAZOLAM HCL 2 MG/2ML IJ SOLN
INTRAMUSCULAR | Status: DC | PRN
  Administered 2022-06-09 (×2): 1 mg via INTRAVENOUS

## 2022-06-09 MED ORDER — HEPARIN (PORCINE) IN NACL 1000-0.9 UT/500ML-% IV SOLN
INTRAVENOUS | Status: AC
  Filled 2022-06-09: qty 1000

## 2022-06-09 MED ORDER — LIDOCAINE HCL (PF) 1 % IJ SOLN
INTRAMUSCULAR | Status: DC | PRN
  Administered 2022-06-09: 15 mL

## 2022-06-09 MED ORDER — ONDANSETRON HCL 4 MG/2ML IJ SOLN: 4.0000 mg | Freq: Four times a day (QID) | INTRAMUSCULAR | Status: DC | PRN

## 2022-06-09 MED ORDER — LIDOCAINE HCL (PF) 1 % IJ SOLN
INTRAMUSCULAR | Status: AC
  Filled 2022-06-09: qty 30

## 2022-06-09 MED ORDER — SODIUM CHLORIDE 0.9 % WEIGHT BASED INFUSION: 1.0000 mL/kg/h | INTRAVENOUS | Status: DC

## 2022-06-09 MED ORDER — ACETAMINOPHEN 325 MG PO TABS: 650.0000 mg | ORAL_TABLET | ORAL | Status: DC | PRN

## 2022-06-09 SURGICAL SUPPLY — 11 items
CATH ANGIO 5F PIGTAIL 65CM (CATHETERS) IMPLANT
KIT MICROPUNCTURE NIT STIFF (SHEATH) IMPLANT
KIT PV (KITS) ×1 IMPLANT
SHEATH PINNACLE 5F 10CM (SHEATH) IMPLANT
SHEATH PROBE COVER 6X72 (BAG) IMPLANT
STOPCOCK MORSE 400PSI 3WAY (MISCELLANEOUS) IMPLANT
SYR MEDRAD MARK 7 150ML (SYRINGE) ×1 IMPLANT
TRANSDUCER W/STOPCOCK (MISCELLANEOUS) ×1 IMPLANT
TRAY PV CATH (CUSTOM PROCEDURE TRAY) ×1 IMPLANT
TUBING CIL FLEX 10 FLL-RA (TUBING) IMPLANT
WIRE HITORQ VERSACORE ST 145CM (WIRE) IMPLANT

## 2022-06-09 NOTE — Progress Notes (Signed)
Site area: Right groin a 5 french arterial sheath was removed by Krystal Eaton RN 2 H  Site Prior to Removal:  Level 0  Pressure Applied For 20 MINUTES    Bedrest Beginning at 1140am X 4 hours  Manual:   Yes.    Patient Status During Pull:  stable  Post Pull Groin Site:  Level 0  Post Pull Instructions Given:  Yes.    Post Pull Pulses Present:  Yes.    Dressing Applied:  Yes.    Comments:

## 2022-06-09 NOTE — Interval H&P Note (Signed)
History and Physical Interval Note:  06/09/2022 10:03 AM  Paul Tyler  has presented today for surgery, with the diagnosis of pad.  The various methods of treatment have been discussed with the patient and family. After consideration of risks, benefits and other options for treatment, the patient has consented to  Procedure(s): ABDOMINAL AORTOGRAM W/LOWER EXTREMITY (N/A) as a surgical intervention.  The patient's history has been reviewed, patient examined, no change in status, stable for surgery.  I have reviewed the patient's chart and labs.  Questions were answered to the patient's satisfaction.     Kathlyn Sacramento

## 2022-06-09 NOTE — Progress Notes (Signed)
Patient and wife was given discharge instructions. Both verbalized understanding. 

## 2022-06-10 ENCOUNTER — Encounter (HOSPITAL_COMMUNITY): Payer: Self-pay | Admitting: Cardiovascular Disease

## 2022-06-29 ENCOUNTER — Encounter: Payer: Self-pay | Admitting: Cardiovascular Disease

## 2022-06-29 ENCOUNTER — Ambulatory Visit: Payer: Medicare Other | Attending: Cardiovascular Disease | Admitting: Cardiovascular Disease

## 2022-06-29 VITALS — BP 138/72 | HR 66 | Ht 66.0 in | Wt 208.6 lb

## 2022-06-29 DIAGNOSIS — E785 Hyperlipidemia, unspecified: Secondary | ICD-10-CM

## 2022-06-29 DIAGNOSIS — I2510 Atherosclerotic heart disease of native coronary artery without angina pectoris: Secondary | ICD-10-CM

## 2022-06-29 DIAGNOSIS — I739 Peripheral vascular disease, unspecified: Secondary | ICD-10-CM | POA: Diagnosis not present

## 2022-06-29 DIAGNOSIS — R0602 Shortness of breath: Secondary | ICD-10-CM

## 2022-06-29 DIAGNOSIS — I10 Essential (primary) hypertension: Secondary | ICD-10-CM | POA: Diagnosis not present

## 2022-06-29 NOTE — Progress Notes (Signed)
Cardiology Office Note   Date:  06/29/2022   ID:  Shell, Yandow 1951-08-12, MRN [id number]  PCP:  Glenis Smoker, MD  Cardiologist:  Dr. Irish Lack  Chief Complaint  Patient presents with   Follow-up      History of Present Illness: Paul Tyler is a 71 y.o. male who is here today for follow-up visit regarding peripheral arterial disease. He has known history of coronary artery disease status post PCI, peripheral arterial disease, essential hypertension, prostate cancer, hyperlipidemia and chronic low back pain. He was seen by Dr. Oneida Alar in 2012 for right lower extremity claudication.  His ABI at that time was 0.48 on the right and normal on the left.  His symptoms are not lifestyle limiting and thus he was treated medically. He was seen recently for severe worsening of bilateral leg claudication happening with minimal exertion.  In addition, he reports numbness in both feet at night when he is trying to sleep. He wakes up to dangle his legs down the bed  He has known history of prostate cancer currently followed by urology.  He is not diabetic and quit smoking 6 years ago.  He does use e-cigarettes.  He underwent noninvasive vascular studies which showed an ABI of 0.40 on the right and 0.37 on the left with absent toe pressure.  Duplex showed severe right common iliac artery stenosis and longer occlusion of the right SFA.  On the left side, the external iliac artery was occluded with longer occlusion of the SFA.  I proceeded with angiography in September which showed on the right side severe heavily calcified stenosis in the common iliac artery, flush occlusion of the SFA with reconstitution distally via collaterals from the profunda and three-vessel runoff below the knee.  On the left, there was flush occlusion of the common iliac artery and external neck artery with reconstitution via collaterals in the proximal common femoral artery, flush occlusion of the SFA with  reconstitution distally via collaterals from the profunda and three-vessel runoff below the knee.  He reports no chest pain.  He has stable exertional dyspnea.  Past Medical History:  Diagnosis Date   Adenomatous colon polyp    Anginal pain ([place])    Anxiety    Coronary artery disease    Hyperlipidemia    Hypertension    Impotence of organic origin    Insomnia, unspecified    Peripheral vascular disease (HCC)    right leg   Prostate cancer (HCC)    Psoriasis    PVD (peripheral vascular disease) (HCC)    Recurrent low back pain    SOB (shortness of breath) on exertion     Past Surgical History:  Procedure Laterality Date   ABDOMINAL AORTOGRAM W/LOWER EXTREMITY N/A 06/09/2022   Procedure: ABDOMINAL AORTOGRAM W/LOWER EXTREMITY;  Surgeon: Wellington Hampshire, MD;  Location: [place] [facility];  Service: Cardiovascular;  Laterality: N/A;   CORONARY ANGIOPLASTY WITH STENT PLACEMENT  04/13/2012   MID LAD   LEFT HEART CATHETERIZATION WITH CORONARY ANGIOGRAM N/A 04/13/2012   Procedure: LEFT HEART CATHETERIZATION WITH CORONARY ANGIOGRAM;  Surgeon: Jettie Booze, MD;  Location: [facility] CATH LAB;  Service: Cardiovascular;  Laterality: N/A;   PROSTATE BIOPSY       Current Outpatient Medications  Medication Sig Dispense Refill   ALPRAZolam (XANAX) 0.25 MG tablet Take 0.125-0.25 mg by mouth 3 (three) times daily as needed for anxiety.     chlorthalidone (HYGROTON) 25 MG tablet Take 25 mg  by mouth every morning.     Cholecalciferol (VITAMIN D) 2000 UNITS CAPS Take 2,000 Units by mouth daily.     clopidogrel (PLAVIX) 75 MG tablet Take 1 tablet by mouth once daily 90 tablet 3   losartan (COZAAR) 100 MG tablet Take 1 tablet by mouth once daily 90 tablet 0   nitroGLYCERIN (NITROSTAT) 0.4 MG SL tablet PLACE ONE TABLET UNDER THE TONGUE EVERY 5 MINUTES AS NEEDED FOR CHEST PAIN. 25 tablet 3   rosuvastatin (CRESTOR) 10 MG tablet Take 10 mg by mouth 4 (four) times a week.     tadalafil (CIALIS) 5 MG  tablet Take 5 mg by mouth daily as needed for erectile dysfunction.     Witch Hazel (TUCKS MEDICATED COOLING EX) Place 1 Pad rectally as needed (irritation).     No current facility-administered medications for this visit.    Allergies:   Lipitor [atorvastatin calcium], Zyban [bupropion hcl], and Zocor [simvastatin]    Social History:  The patient  reports that he quit smoking about 11 years ago. His smoking use included cigarettes. He has a 45.00 pack-year smoking history. He has never used smokeless tobacco. He reports current alcohol use of about 3.0 - 4.0 standard drinks of alcohol per week. He reports that he does not use drugs.   Family History:  The patient's family history includes Colon cancer in his maternal grandfather; Diabetes in his father and mother; Heart disease in his father and mother; Prostate cancer in his father, maternal uncle, and maternal uncle; Stroke in his mother.    ROS:  Please see the history of present illness.   Otherwise, review of systems are positive for none.   All other systems are reviewed and negative.    PHYSICAL EXAM: VS:  BP 138/72 (BP Location: Right Arm, Patient Position: Sitting, Cuff Size: Large)   Pulse 66   Ht '5\' 6"'$  (1.676 m)   Wt 208 lb 9.6 oz (94.6 kg)   SpO2 98%   BMI 33.67 kg/m  , BMI Body mass index is 33.67 kg/m. GEN: Well nourished, well developed, in no acute distress  HEENT: normal  Neck: no JVD, carotid bruits, or masses Cardiac: RRR; no murmurs, rubs, or gallops,no edema  Respiratory:  clear to auscultation bilaterally, normal work of breathing GI: soft, nontender, nondistended, + BS MS: no deformity or atrophy  Skin: warm and dry, no rash Neuro:  Strength and sensation are intact Psych: euthymic mood, full affect Vascular: Femoral pulses is absent bilaterally.  Distal pulses are not palpable.  Radial pulses +2 bilaterally. No groin hematoma  EKG:  EKG is not ordered today.    Recent Labs: 05/25/2022: BUN 24;  Creatinine, Ser 1.25; Hemoglobin 14.0; Platelets 234; Potassium 4.8; Sodium 145    Lipid Panel No results found for: "CHOL", "TRIG", "HDL", "CHOLHDL", "VLDL", "LDLCALC", "LDLDIRECT"    Wt Readings from Last 3 Encounters:  06/29/22 208 lb 9.6 oz (94.6 kg)  06/09/22 205 lb (93 kg)  05/25/22 207 lb 3.2 oz (94 kg)           No data to display            ASSESSMENT AND PLAN:  1.  Peripheral arterial disease: Severe claudication as well as rest pain due to multilevel disease including iliac and SFA disease.  His symptoms are clearly lifestyle limiting.  I recommend aortobifemoral bypass.  The patient is scheduled to see Dr. Virl Cagey next month.  2.  Coronary artery disease involving native coronary arteries  without angina: He reports no chest pain but does complain of exertional dyspnea.  Nuclear stress test in 2021 showed no evidence of ischemia.  I requested an echocardiogram.  3.  Essential hypertension: Blood pressure is reasonably controlled.  4.  Hyperlipidemia: Currently on rosuvastatin 10 mg 4 times a week with known history of statin induced myalgia.  I reviewed his labs done in January which showed an LDL of 84. Consider adding ezetimibe or PCSK9 inhibitor.  5.  Preop cardiovascular evaluation for aortobifemoral bypass: He seems to be stable from a cardiac standpoint but given shortness of breath, I requested an echocardiogram.  If echocardiogram does not show significant abnormalities, the patient can proceed at an overall acceptable surgical risk.   Disposition:   Follow up with me in 4 months.  Signed,  Kathlyn Sacramento, MD  06/29/2022 8:41 AM    [place]

## 2022-06-29 NOTE — Patient Instructions (Signed)
Medication Instructions:  No changes *If you need a refill on your cardiac medications before your next appointment, please call your pharmacy*   Lab Work: None ordered If you have labs (blood work) drawn today and your tests are completely normal, you will receive your results only by: [place] (if you have MyChart) OR A paper copy in the mail If you have any lab test that is abnormal or we need to change your treatment, we will call you to review the results.   Testing/Procedures: Your physician has requested that you have an echocardiogram. Echocardiography is a painless test that uses sound waves to create images of your heart. It provides your doctor with information about the size and shape of your heart and how well your heart's chambers and valves are working. You may receive an ultrasound enhancing agent through an IV if needed to better visualize your heart during the echo.This procedure takes approximately one hour. There are no restrictions for this procedure. This will take place at the [address].     Follow-Up: At [facility], you and your health needs are our priority.  As part of our continuing mission to provide you with exceptional heart care, we have created designated Provider Care Teams.  These Care Teams include your primary Cardiologist (physician) and Advanced Practice Providers (APPs -  Physician Assistants and Nurse Practitioners) who all work together to provide you with the care you need, when you need it.  We recommend signing up for the patient portal called "MyChart".  Sign up information is provided on this After Visit Summary.  MyChart is used to connect with patients for Virtual Visits (Telemedicine).  Patients are able to view lab/test results, encounter notes, upcoming appointments, etc.  Non-urgent messages can be sent to your provider as well.   To learn more about what you can do with MyChart, go to  [email or web address].    Your next appointment:   4 month(s)  The format for your next appointment:   In Person  Provider:   Dr. Fletcher Anon  Important Information About Sugar

## 2022-07-02 ENCOUNTER — Other Ambulatory Visit: Payer: Self-pay

## 2022-07-02 DIAGNOSIS — I25119 Atherosclerotic heart disease of native coronary artery with unspecified angina pectoris: Secondary | ICD-10-CM

## 2022-07-02 DIAGNOSIS — I739 Peripheral vascular disease, unspecified: Secondary | ICD-10-CM

## 2022-07-05 DIAGNOSIS — C61 Malignant neoplasm of prostate: Secondary | ICD-10-CM | POA: Diagnosis not present

## 2022-07-12 DIAGNOSIS — R972 Elevated prostate specific antigen [PSA]: Secondary | ICD-10-CM | POA: Diagnosis not present

## 2022-07-12 DIAGNOSIS — R351 Nocturia: Secondary | ICD-10-CM | POA: Diagnosis not present

## 2022-07-12 DIAGNOSIS — C61 Malignant neoplasm of prostate: Secondary | ICD-10-CM | POA: Diagnosis not present

## 2022-07-12 DIAGNOSIS — N403 Nodular prostate with lower urinary tract symptoms: Secondary | ICD-10-CM | POA: Diagnosis not present

## 2022-07-13 ENCOUNTER — Ambulatory Visit (HOSPITAL_COMMUNITY): Payer: Medicare Other | Attending: Internal Medicine

## 2022-07-13 DIAGNOSIS — R0602 Shortness of breath: Secondary | ICD-10-CM | POA: Insufficient documentation

## 2022-07-13 LAB — ECHOCARDIOGRAM COMPLETE
Area-P 1/2: 3.99 cm2
S' Lateral: 2.1 cm

## 2022-07-17 ENCOUNTER — Other Ambulatory Visit: Payer: Self-pay | Admitting: Interventional Cardiology

## 2022-07-19 ENCOUNTER — Other Ambulatory Visit (HOSPITAL_COMMUNITY): Payer: Self-pay | Admitting: Urology

## 2022-07-19 DIAGNOSIS — C61 Malignant neoplasm of prostate: Secondary | ICD-10-CM

## 2022-07-20 ENCOUNTER — Other Ambulatory Visit: Payer: Self-pay

## 2022-07-20 DIAGNOSIS — I25119 Atherosclerotic heart disease of native coronary artery with unspecified angina pectoris: Secondary | ICD-10-CM

## 2022-07-20 DIAGNOSIS — I739 Peripheral vascular disease, unspecified: Secondary | ICD-10-CM

## 2022-07-20 NOTE — Addendum Note (Signed)
Addended by: Kaleen Mask on: 07/20/2022 02:36 PM   Modules accepted: Orders

## 2022-07-27 ENCOUNTER — Other Ambulatory Visit: Payer: Medicare Other

## 2022-07-27 ENCOUNTER — Ambulatory Visit
Admission: RE | Admit: 2022-07-27 | Discharge: 2022-07-27 | Disposition: A | Discharge status: Home / Self Care | Payer: Medicare Other | Source: Ambulatory Visit | Attending: Vascular Surgery | Admitting: Vascular Surgery

## 2022-07-27 DIAGNOSIS — K429 Umbilical hernia without obstruction or gangrene: Secondary | ICD-10-CM | POA: Diagnosis not present

## 2022-07-27 DIAGNOSIS — I70213 Atherosclerosis of native arteries of extremities with intermittent claudication, bilateral legs: Secondary | ICD-10-CM | POA: Diagnosis not present

## 2022-07-27 DIAGNOSIS — I2510 Atherosclerotic heart disease of native coronary artery without angina pectoris: Secondary | ICD-10-CM | POA: Diagnosis not present

## 2022-07-27 DIAGNOSIS — I25119 Atherosclerotic heart disease of native coronary artery with unspecified angina pectoris: Secondary | ICD-10-CM

## 2022-07-27 DIAGNOSIS — I739 Peripheral vascular disease, unspecified: Secondary | ICD-10-CM

## 2022-07-27 DIAGNOSIS — M5136 Other intervertebral disc degeneration, lumbar region: Secondary | ICD-10-CM | POA: Diagnosis not present

## 2022-07-27 MED ORDER — IOPAMIDOL (ISOVUE-370) INJECTION 76%
100.0000 mL | Freq: Once | INTRAVENOUS | Status: AC | PRN
  Administered 2022-07-27: 100 mL via INTRAVENOUS

## 2022-07-28 ENCOUNTER — Telehealth: Payer: Self-pay

## 2022-07-28 NOTE — Progress Notes (Unsigned)
Office Note     CC: Peripheral arterial disease Requesting Provider:  Glenis Smoker, *  HPI: Paul Tyler is a 71 y.o. ([birth year]) male presenting at the request of .Glenis Smoker, MD   On exam today, Paul Tyler was doing well, accompanied by his wife.  Now retired, he worked as a Geophysicist/field seismologist for years.  Over the last 3 years he is enjoyed piddling around the house, working with tools, and spending time with his wife.  Paul Tyler presents today at the request of Dr. Fletcher Anon for bilateral lower extremity lifestyle-limiting claudication.  He underwent recent angiography demonstrating severe inflow disease as well as severe disease in the left common femoral artery, bilateral superficial femoral arteries. Paul Tyler describes bilateral lower extremity pain in his calves when walking short distances.  He denies rest pain at night, but notes numbness which improves by placing his feet in the dependent position on the side of the bed.  Paul Tyler denies previous history of abdominal surgeries.  He has had no prior wound healing issues.  Tobacco hx: Former  Past Medical History:  Diagnosis Date   Adenomatous colon polyp    Anginal pain ([place])    Anxiety    Coronary artery disease    Hyperlipidemia    Hypertension    Impotence of organic origin    Insomnia, unspecified    Peripheral vascular disease (HCC)    right leg   Prostate cancer (HCC)    Psoriasis    PVD (peripheral vascular disease) (HCC)    Recurrent low back pain    SOB (shortness of breath) on exertion     Past Surgical History:  Procedure Laterality Date   ABDOMINAL AORTOGRAM W/LOWER EXTREMITY N/A 06/09/2022   Procedure: ABDOMINAL AORTOGRAM W/LOWER EXTREMITY;  Surgeon: Wellington Hampshire, MD;  Location: [place];  Service: Cardiovascular;  Laterality: N/A;   CORONARY ANGIOPLASTY WITH STENT PLACEMENT  04/13/2012   MID LAD   LEFT HEART CATHETERIZATION WITH CORONARY ANGIOGRAM N/A 04/13/2012   Procedure: LEFT  HEART CATHETERIZATION WITH CORONARY ANGIOGRAM;  Surgeon: Jettie Booze, MD;  Location: [facility] C[facility];  Service: Cardiovascular;  Laterality: N/A;   PROSTATE BIOPSY      Social History   Socioeconomic History   Marital status: Married    Spouse name: Lovey Newcomer   Number of children: 1   Years of education: Not on file   Highest education level: Not on file  Occupational History    Comment: Full time  Tobacco Use   Smoking status: Former    Packs/day: 1.50    Years: 30.00    Total pack years: 45.00    Types: Cigarettes    Quit date: 04/18/2011    Years since quitting: 11.2   Smokeless tobacco: Never   Tobacco comments:    Patient is currently not smoking  Vaping Use   Vaping Use: Never used  Substance and Sexual Activity   Alcohol use: Yes    Alcohol/week: 3.0 - 4.0 standard drinks of alcohol    Types: 3 - 4 Cans of beer per week    Comment: 3-4/day twice a week   Drug use: No   Sexual activity: Yes  Other Topics Concern   Not on file  Social History Narrative   Resides in [place] with wife, [place]. Continues to work full time.    Social Determinants of Health   Financial Resource Strain: Not on file  Food Insecurity: Not on file  Transportation Needs: Not on file  Physical Activity: Not on file  Stress: Not on file  Social Connections: Not on file  Intimate Partner Violence: Not on file   Family History  Problem Relation Age of Onset   Prostate cancer Father    Heart disease Father    Diabetes Father    Heart disease Mother    Stroke Mother        On Coumadin   Diabetes Mother        Borderline, no meds   Prostate cancer Maternal Uncle        prostate cancer   Colon cancer Maternal Grandfather    Prostate cancer Maternal Uncle    Heart attack Neg Hx    Hypertension Neg Hx     Current Outpatient Medications  Medication Sig Dispense Refill   ALPRAZolam (XANAX) 0.25 MG tablet Take 0.125-0.25 mg by mouth 3 (three) times daily as needed for anxiety.      chlorthalidone (HYGROTON) 25 MG tablet Take 25 mg by mouth every morning.     Cholecalciferol (VITAMIN D) 2000 UNITS CAPS Take 2,000 Units by mouth daily.     clopidogrel (PLAVIX) 75 MG tablet Take 1 tablet by mouth once daily 90 tablet 3   losartan (COZAAR) 100 MG tablet Take 1 tablet by mouth once daily 90 tablet 1   nitroGLYCERIN (NITROSTAT) 0.4 MG SL tablet PLACE ONE TABLET UNDER THE TONGUE EVERY 5 MINUTES AS NEEDED FOR CHEST PAIN. 25 tablet 3   rosuvastatin (CRESTOR) 10 MG tablet Take 10 mg by mouth 4 (four) times a week.     tadalafil (CIALIS) 5 MG tablet Take 5 mg by mouth daily as needed for erectile dysfunction.     Witch Hazel (TUCKS MEDICATED COOLING EX) Place 1 Pad rectally as needed (irritation).     No current facility-administered medications for this visit.    Allergies  Allergen Reactions   Lipitor [Atorvastatin Calcium] Nausea And Vomiting    Causes dizziness   Zyban [Bupropion Hcl] Rash and Other (See Comments)    Causes joint pain   Zocor [Simvastatin]     Causes muscle pain     REVIEW OF SYSTEMS:  '[X]'$  denotes positive finding, '[ ]'$  denotes negative finding Cardiac  Comments:  Chest pain or chest pressure:    Shortness of breath upon exertion:    Short of breath when lying flat:    Irregular heart rhythm:        Vascular    Pain in calf, thigh, or hip brought on by ambulation:    Pain in feet at night that wakes you up from your sleep:     Blood clot in your veins:    Leg swelling:         Pulmonary    Oxygen at home:    Productive cough:     Wheezing:         Neurologic    Sudden weakness in arms or legs:     Sudden numbness in arms or legs:     Sudden onset of difficulty speaking or slurred speech:    Temporary loss of vision in one eye:     Problems with dizziness:         Gastrointestinal    Blood in stool:     Vomited blood:         Genitourinary    Burning when urinating:     Blood in urine:        Psychiatric    Major depression:  Hematologic    Bleeding problems:    Problems with blood clotting too easily:        Skin    Rashes or ulcers:        Constitutional    Fever or chills:      PHYSICAL EXAMINATION:  There were no vitals filed for this visit.  General:  WDWN in NAD; vital signs documented above Gait: Not observed HENT: WNL, normocephalic Pulmonary: normal non-labored breathing , without wheezing Cardiac: regular HR Abdomen: soft, NT, no masses Skin: without rashes Vascular Exam/Pulses:  Right Left  Radial 2+ (normal) 2+ (normal)  Ulnar    Femoral absent absent               Extremities: without ischemic changes, without Gangrene , without cellulitis; without open wounds;  Musculoskeletal: no muscle wasting or atrophy  Neurologic: A&O X 3;  No focal weakness or paresthesias are detected Psychiatric:  The pt has Normal affect.   Non-Invasive Vascular Imaging:   +-------+-----------+-----------+------------+------------+  ABI/TBIToday's ABIToday's TBIPrevious ABIPrevious TBI  +-------+-----------+-----------+------------+------------+  Right .40        0.0        .48         not assessed  +-------+-----------+-----------+------------+------------+  Left  .37        0.0        1.19        not assessed  +-------+-----------+-----------+------------+------------+         ASSESSMENT/PLAN: DINK CREPS is a 71 y.o. male with severe bilateral lower extremity peripheral arterial disease. Imaging was reviewed demonstrating occluded left iliac system, left common femoral artery, right common iliac artery with severe stenosis.  Distally, bilateral superficial femoral arteries occluded.  ABIs demonstrate no toe pressure, monophasic waveforms at the level of the ankle. On physical exam, he has nonpalpable pulses in the feet.  In discussing his symptoms, It was difficult to ascertain whether Paul Tyler's symptoms have now progressed to rest pain.  While no chronic pain in the  calves and feet at night, the numbness and tingling he is experiencing which improves in the dependent position is consistent with arterial insufficiency.  I had a long discussion with Paul Tyler regarding the above.  At age 20, he would be best served with aortobifemoral bypass surgery.  He is aware that this comes with significant risk, and a 5% mortality.  His obesity increases his risk of mortality as it adds to the complexity of the operation.  At the time of his aortobifemoral bypass, he will need bilateral common femoral endarterectomies.  I do think this operation would cure his rest pain, and significantly improve his claudication symptoms.  More importantly, with toe pressures of 0, I am concerned that if we wait until a wound develops, he would need both an aortobifemoral bypass and a distal bypass to provide inline flow to the foot for wound healing.    Obviously this discussion was a lot for both Paul Tyler and his wife to take in.  Symptoms have been unchanged since he was seen by Dr. Sophronia Simas.  We both agreed that it would be best to meet after the holidays after both he and his wife have had time to think about the surgery further.  I asked him to call my office should any questions or concerns arise.   Broadus John, MD [facility] [phone] Total time of patient care including pre-visit research, consultation, and documentation greater than 60 minutes

## 2022-07-28 NOTE — Telephone Encounter (Addendum)
Patient called back to say he already had this CTA done at [facility].  I advised him that we will cancel out the duplicate order.  Pt is aware.  ----- Message from Cathlean Cower sent at 07/28/2022  9:47 AM EST ----- Regarding: RE: CTA Left 2nd message ----- Message ----- From: Kaleen Mask, LPN Sent: 93/05/299  10:14 AM EST To: April H Pait; Cathlean Cower; # Subject: CTA                                            Hello.  Please call patient to schedule prior to appt on 08/13/22.  Thanks.

## 2022-07-30 ENCOUNTER — Encounter: Payer: Self-pay | Admitting: Vascular Surgery

## 2022-07-30 ENCOUNTER — Ambulatory Visit: Payer: Medicare Other | Admitting: Vascular Surgery

## 2022-07-30 VITALS — BP 143/71 | HR 77 | Temp 98.3°F | Resp 20 | Ht 66.0 in | Wt 208.0 lb

## 2022-07-30 DIAGNOSIS — I70223 Atherosclerosis of native arteries of extremities with rest pain, bilateral legs: Secondary | ICD-10-CM | POA: Diagnosis not present

## 2022-07-30 IMAGING — MR MR PROSTATE WO/W CM
12 series · 48 of 48 positions shown · IV contrast (multihance)
Comparison: Prior prostate imaging from Monday August, 2018.

CLINICAL DATA: History of prostate cancer, Gleason 7 disease by
report. Latest PSA

EXAM:
MR PROSTATE WITHOUT AND WITH CONTRAST
TECHNIQUE: Multiplanar multisequence MRI images were obtained of the pelvis
centered about the prostate. Pre and post contrast images were
obtained.
CONTRAST:  20mL MULTIHANCE GADOBENATE DIMEGLUMINE 529 MG/ML IV SOLN

[Series 4: T2 · coronal · 3.0mm · 0.56mm/px · 1 of 23 slices shown (1 of 3)]
[im 1/23]
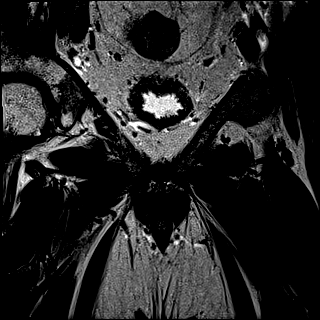

[Series 5: T1 · axial · 5.0mm · 1.25mm/px · z∈[-61,+134]mm · 2 of 80 slices shown]
[im 1/80]
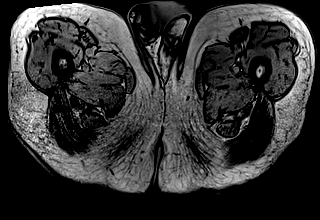
[im 80/80]
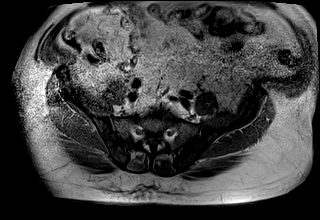

[Series 6: DWI · axial · 3.0mm · 1.75mm/px · 1 of 60 slices shown (1 of 3)]
[im 1/60]
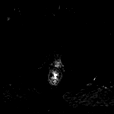

[Series 7: DWI · axial · 3.0mm · 1.75mm/px · 1 of 20 slices shown (2 of 3)]
[im 1/20]
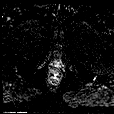

[Series 8: DWI · axial · 3.0mm · 1.75mm/px · 1 of 20 slices shown (3 of 3)]
[im 1/20]
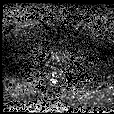

[Series 9: T2 · axial · 3.0mm · 0.56mm/px · 1 of 23 slices shown (2 of 3)]
[im 1/23]
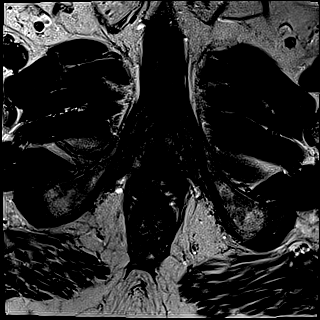

[Series 10: T2 · axial · 1.0mm · 1.04mm/px · z∈[-18,+61]mm · 3 of 80 slices shown (3 of 3)]
[im 1/80]
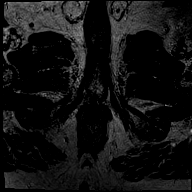
[im 40/80]
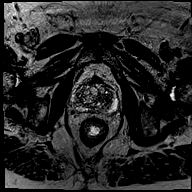
[im 80/80]
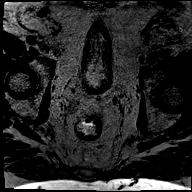

[Series 11: pre t1_twist_tra_dyn · axial · non-contrast · 3.5mm · 0.83mm/px · 1 of 16 slices shown]
[im 1/16]
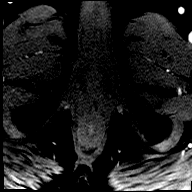

[Series 12: post t1_twist_tra_dyn-copy center · axial · non-contrast · 3.5mm · 0.83mm/px · z∈[-9,+44]mm · 16 of 480 slices shown]
[im 1/480]
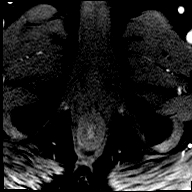
[im 32/480]
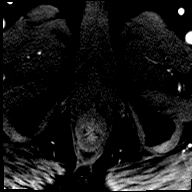
[im 64/480]
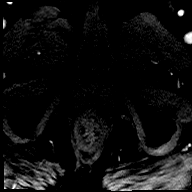
[im 96/480]
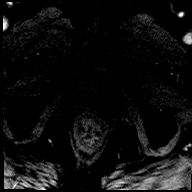
[im 128/480]
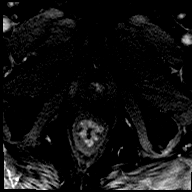
[im 160/480]
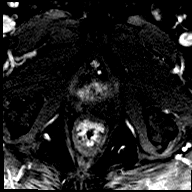
[im 192/480]
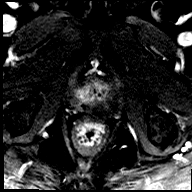
[im 224/480]
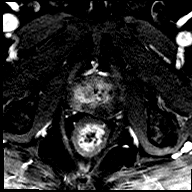
[im 256/480]
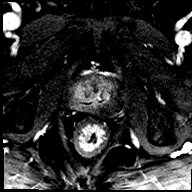
[im 288/480]
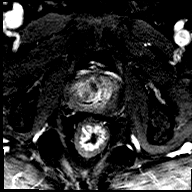
[im 320/480]
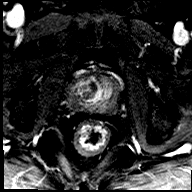
[im 352/480]
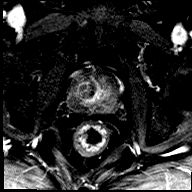
[im 384/480]
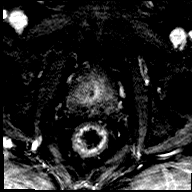
[im 416/480]
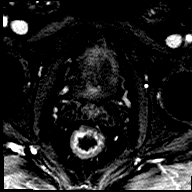
[im 448/480]
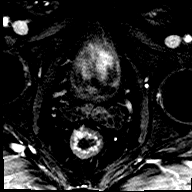
[im 480/480]
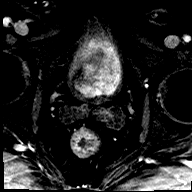

[Series 13: post t1_twist_tra_dyn-copy cent_sub · axial · 3.5mm · 0.83mm/px · z∈[-9,+44]mm · 15 of 452 slices shown]
[im 1/452]
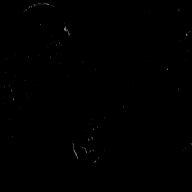
[im 33/452]
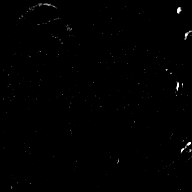
[im 65/452]
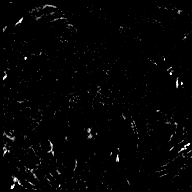
[im 97/452]
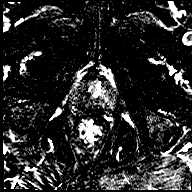
[im 129/452]
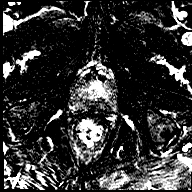
[im 162/452]
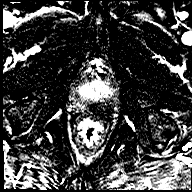
[im 194/452]
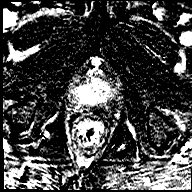
[im 226/452]
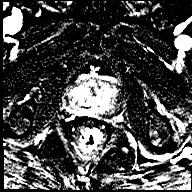
[im 258/452]
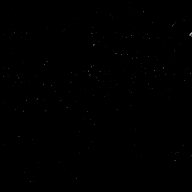
[im 290/452]
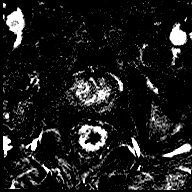
[im 323/452]
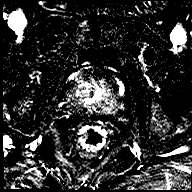
[im 355/452]
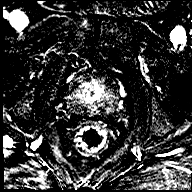
[im 387/452]
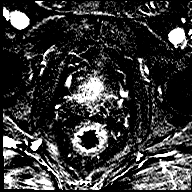
[im 419/452]
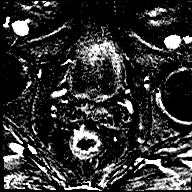
[im 452/452]
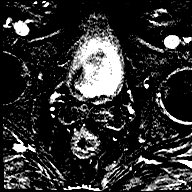

[Series 14: t1_vibe_dixon_tra_f · axial · 2.5mm · 0.91mm/px · z∈[-62,+135]mm · 3 of 80 slices shown]
[im 1/80]
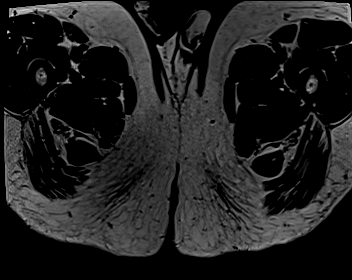
[im 40/80]
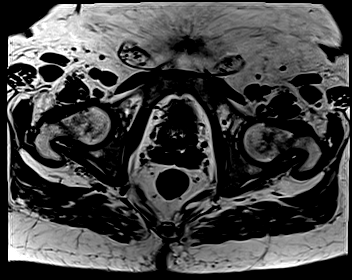
[im 80/80]
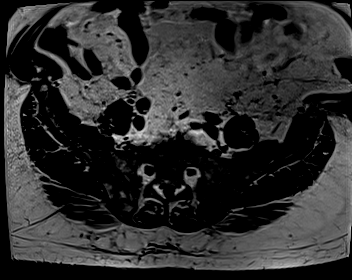

[Series 15: t1_vibe_dixon_tra_w · axial · 2.5mm · 0.91mm/px · z∈[-62,+135]mm · 3 of 80 slices shown]
[im 1/80]
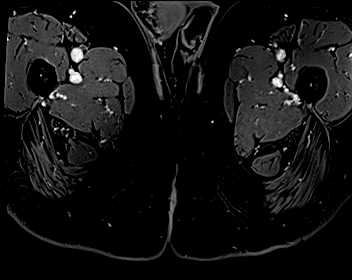
[im 40/80]
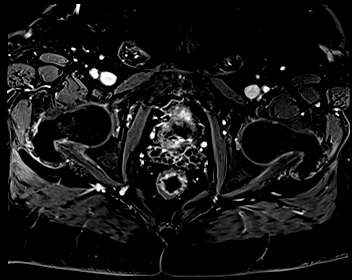
[im 80/80]
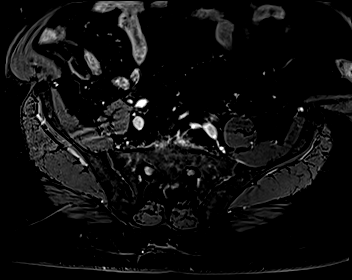

[48 of 48 positions shown; findings below may reference images not displayed]

FINDINGS: Prostate:

Transitional zone: Signs of BPH and no sign of high-risk lesion in
the transitional zone.

Peripheral zone: Atrophy of the RIGHT peripheral zone as compared to
the LEFT. Very low signal on T2 likely calcification given that it
remains dark on diffusion-weighted imaging and is unchanged compared
to prior studies in the RIGHT mid peripheral zone. Linear and
wedge-shaped areas of T2 hypointensity in the RIGHT peripheral zone
which is again atrophied relative to the LEFT.

No sign high-risk lesion in the prostate by imaging.

Volume: 4.9 x 3.6 x 4.2 (volume = 39 cc) cm

PSA density: 0.19 ng per mL

Transcapsular spread:  Absent

Seminal vesicle involvement: Absent

Neurovascular bundle involvement: Absent

Pelvic adenopathy: Absent with respect to visualized pelvis which is
imaged from just below the iliac bifurcation through the pelvic
floor.

Bone metastasis: Absent

Other findings: None
IMPRESSION: 1. No sign of high-risk lesion in the prostate. Overall assessment
PIRADS category 2 by imaging but in a patient with known prostate
cancer.
2. No signs of extracapsular disease.
3. Atrophy of the RIGHT as compared to the LEFT gland presumably due
to prior prostatitis.
4. Changes of BPH.

## 2022-08-02 ENCOUNTER — Encounter (HOSPITAL_COMMUNITY)
Admission: RE | Admit: 2022-08-02 | Discharge: 2022-08-02 | Disposition: A | Discharge status: Home / Self Care | Payer: Medicare Other | Source: Ambulatory Visit | Attending: Urology | Admitting: Urology

## 2022-08-02 DIAGNOSIS — C61 Malignant neoplasm of prostate: Secondary | ICD-10-CM | POA: Diagnosis not present

## 2022-08-02 DIAGNOSIS — R918 Other nonspecific abnormal finding of lung field: Secondary | ICD-10-CM | POA: Diagnosis not present

## 2022-08-02 MED ORDER — PIFLIFOLASTAT F 18 (PYLARIFY) INJECTION
9.0000 | Freq: Once | INTRAVENOUS | Status: AC
  Administered 2022-08-02: 9.57 via INTRAVENOUS

## 2022-08-12 ENCOUNTER — Other Ambulatory Visit: Payer: Medicare Other

## 2022-08-13 ENCOUNTER — Encounter: Payer: Medicare Other | Admitting: Vascular Surgery

## 2022-09-01 ENCOUNTER — Other Ambulatory Visit: Payer: Self-pay | Admitting: Interventional Cardiology

## 2022-09-02 ENCOUNTER — Other Ambulatory Visit: Payer: Self-pay

## 2022-09-02 MED ORDER — CLOPIDOGREL BISULFATE 75 MG PO TABS: 75.0000 mg | ORAL_TABLET | Freq: Every day | ORAL | 3 refills | Status: DC

## 2022-09-02 NOTE — Telephone Encounter (Signed)
Pt's medication was sent to pt's pharmacy as requested. Confirmation received.  °

## 2022-09-10 ENCOUNTER — Ambulatory Visit: Payer: Medicare Other | Admitting: Vascular Surgery

## 2022-09-10 ENCOUNTER — Encounter: Payer: Self-pay | Admitting: Vascular Surgery

## 2022-09-10 VITALS — BP 115/70 | HR 73 | Temp 98.2°F | Resp 20 | Ht 66.0 in | Wt 208.0 lb

## 2022-09-10 DIAGNOSIS — I739 Peripheral vascular disease, unspecified: Secondary | ICD-10-CM

## 2022-09-10 NOTE — Progress Notes (Signed)
Office Note     CC: Peripheral arterial disease Requesting Provider:  Glenis Smoker, *  HPI: FLORENCIO HOLLIBAUGH is a 71 y.o. 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  Is well-known to me.  Now retired, he worked as a Geophysicist/field seismologist for years.  Over the last 3 years he has enjoyed retirement, pedaling around the house, working with tools, spending time with his wife, kids, and grandchildren.  Ronalee Belts has a history of bilateral lower extremity lifestyle limiting claudication for which he has been followed by Dr. Sophronia Simas.  An angiogram was performed earlier this year demonstrating severe inflow disease that was not amenable to endovascular intervention.  At time of our last meeting, we discussed aortobifemoral bypass in the morbidity and mortality associated.  At the time, Ronalee Belts stated that his claudication symptoms were not so severe, that he was ready to pursue surgery.  On exam today, Ronalee Belts was doing well, accompanied by his wife.  He had a wonderful Christmas, and spent time with his kids and grandkids.  He denies progression in lower extremity claudication symptoms.  He is able to walk around [employer] focused on certain items, however when bruising places like [place], he uses the self-propelled cart.  He denies lower extremity rest pain, but does note some waxing waning numbness in the toes and hands when sleeping.  Improves by placing his feet in a dependent position.  No abdominal surgeries.  He denies wounds on the feet, no issues with prior wound healing.  Of note, Ronalee Belts has prostate cancer which has been treated conservatively up to this point.  He has not undergone any chemo, radiation.  Tobacco hx: Former  Past Medical History:  Diagnosis Date   Adenomatous colon polyp    Anginal pain ([place])    Anxiety    Coronary artery disease    Hyperlipidemia    Hypertension    Impotence of organic origin    Insomnia,  unspecified    Peripheral vascular disease (HCC)    right leg   Prostate cancer (HCC)    Psoriasis    PVD (peripheral vascular disease) (HCC)    Recurrent low back pain    SOB (shortness of breath) on exertion     Past Surgical History:  Procedure Laterality Date   ABDOMINAL AORTOGRAM W/LOWER EXTREMITY N/A 06/09/2022   Procedure: ABDOMINAL AORTOGRAM W/LOWER EXTREMITY;  Surgeon: Wellington Hampshire, MD;  Location: [facility];  Service: Cardiovascular;  Laterality: N/A;   CORONARY ANGIOPLASTY WITH STENT PLACEMENT  04/13/2012   MID LAD   LEFT HEART CATHETERIZATION WITH CORONARY ANGIOGRAM N/A 04/13/2012   Procedure: LEFT HEART CATHETERIZATION WITH CORONARY ANGIOGRAM;  Surgeon: Jettie Booze, MD;  Location: [facility] CATH LAB;  Service: Cardiovascular;  Laterality: N/A;   PROSTATE BIOPSY      Social History   Socioeconomic History   Marital status: Married    Spouse name: Lovey Newcomer   Number of children: 1   Years of education: Not on file   Highest education level: Not on file  Occupational History    Comment: Full time  Tobacco Use   Smoking status: Former    Packs/day: 1.50    Years: 30.00    Total pack years: 45.00    Types: Cigarettes    Quit date: 04/18/2011    Years since quitting: 11.4   Smokeless tobacco: Never   Tobacco comments:    Patient is currently not smoking  Vaping  Use   Vaping Use: Never used  Substance and Sexual Activity   Alcohol use: Yes    Alcohol/week: 3.0 - 4.0 standard drinks of alcohol    Types: 3 - 4 Cans of beer per week    Comment: 3-4/day twice a week   Drug use: No   Sexual activity: Yes  Other Topics Concern   Not on file  Social History Narrative   Resides in [place] with wife, [place]. Continues to work full time.    Social Determinants of Health   Financial Resource Strain: Not on file  Food Insecurity: Not on file  Transportation Needs: Not on file  Physical Activity: Not on file  Stress: Not on file  Social Connections: Not  on file  Intimate Partner Violence: Not on file   Family History  Problem Relation Age of Onset   Prostate cancer Father    Heart disease Father    Diabetes Father    Heart disease Mother    Stroke Mother        On Coumadin   Diabetes Mother        Borderline, no meds   Prostate cancer Maternal Uncle        prostate cancer   Colon cancer Maternal Grandfather    Prostate cancer Maternal Uncle    Heart attack Neg Hx    Hypertension Neg Hx     Current Outpatient Medications  Medication Sig Dispense Refill   ALPRAZolam (XANAX) 0.25 MG tablet Take 0.125-0.25 mg by mouth 3 (three) times daily as needed for anxiety.     chlorthalidone (HYGROTON) 25 MG tablet Take 25 mg by mouth every morning.     Cholecalciferol (VITAMIN D) 2000 UNITS CAPS Take 2,000 Units by mouth daily.     clopidogrel (PLAVIX) 75 MG tablet Take 1 tablet (75 mg total) by mouth daily. 90 tablet 3   losartan (COZAAR) 100 MG tablet Take 1 tablet by mouth once daily 90 tablet 1   nitroGLYCERIN (NITROSTAT) 0.4 MG SL tablet PLACE ONE TABLET UNDER THE TONGUE EVERY 5 MINUTES AS NEEDED FOR CHEST PAIN. 25 tablet 3   rosuvastatin (CRESTOR) 10 MG tablet Take 10 mg by mouth 4 (four) times a week.     tadalafil (CIALIS) 5 MG tablet Take 5 mg by mouth daily as needed for erectile dysfunction.     Witch Hazel (TUCKS MEDICATED COOLING EX) Place 1 Pad rectally as needed (irritation).     No current facility-administered medications for this visit.    Allergies  Allergen Reactions   Lipitor [Atorvastatin Calcium] Nausea And Vomiting    Causes dizziness   Zyban [Bupropion Hcl] Rash and Other (See Comments)    Causes joint pain   Zocor [Simvastatin]     Causes muscle pain     REVIEW OF SYSTEMS:  '[X]'$  denotes positive finding, '[ ]'$  denotes negative finding Cardiac  Comments:  Chest pain or chest pressure:    Shortness of breath upon exertion:    Short of breath when lying flat:    Irregular heart rhythm:        Vascular     Pain in calf, thigh, or hip brought on by ambulation:    Pain in feet at night that wakes you up from your sleep:     Blood clot in your veins:    Leg swelling:         Pulmonary    Oxygen at home:    Productive cough:  Wheezing:         Neurologic    Sudden weakness in arms or legs:     Sudden numbness in arms or legs:     Sudden onset of difficulty speaking or slurred speech:    Temporary loss of vision in one eye:     Problems with dizziness:         Gastrointestinal    Blood in stool:     Vomited blood:         Genitourinary    Burning when urinating:     Blood in urine:        Psychiatric    Major depression:         Hematologic    Bleeding problems:    Problems with blood clotting too easily:        Skin    Rashes or ulcers:        Constitutional    Fever or chills:      PHYSICAL EXAMINATION:  Vitals:   09/10/22 0819  BP: 115/70  Pulse: 73  Resp: 20  Temp: 98.2 F (36.8 C)  SpO2: 98%  Weight: 208 lb (94.3 kg)  Height: '5\' 6"'$  (1.676 m)    General:  WDWN in NAD; vital signs documented above Gait: Not observed HENT: WNL, normocephalic Pulmonary: normal non-labored breathing , without wheezing Cardiac: regular HR Abdomen: soft, NT, no masses Skin: without rashes Vascular Exam/Pulses:  Right Left  Radial 2+ (normal) 2+ (normal)  Ulnar    Femoral absent absent               Extremities: without ischemic changes, without Gangrene , without cellulitis; without open wounds;  Musculoskeletal: no muscle wasting or atrophy  Neurologic: A&O X 3;  No focal weakness or paresthesias are detected Psychiatric:  The pt has Normal affect.   Non-Invasive Vascular Imaging:   +-------+-----------+-----------+------------+------------+  ABI/TBIToday's ABIToday's TBIPrevious ABIPrevious TBI  +-------+-----------+-----------+------------+------------+  Right .40        0.0        .48         not assessed   +-------+-----------+-----------+------------+------------+  Left  .37        0.0        1.19        not assessed  +-------+-----------+-----------+------------+------------+         ASSESSMENT/PLAN: JANIEL CRISOSTOMO is a 71 y.o. male with severe bilateral lower extremity peripheral arterial disease.  Imaging demonstrates occluded left iliac system, left common femoral artery, right common iliac artery with severe stenosis.  Distally, bilateral superficial femoral arteries occluded.  ABIs demonstrate no toe pressure, monophasic waveforms at the level of the ankle. On physical exam, he has nonpalpable pulses in the feet.  I am happy that symptoms have not progressed, and that he is still able to ambulate around [employer], I am concerned that more ambulation, like what he describes in [place], requires a motorized cart. We discussed indications for surgery, Ronalee Belts has severe claudication symptoms, but right now he does not feel like these are lifestyle limiting.think the lower extremity numbness and tingling which improves in the dependent position is consistent with severe arterial insufficiency.    Ronalee Belts and I revisited discussion regarding aortobifemoral bypass.  He is aware that this comes with significant risk, and a 5% mortality.  His obesity increases his risk of mortality as it adds to the complexity of the operation.  At the time of his aortobifemoral bypass, he will need bilateral common  femoral endarterectomies.  I do think this operation would cure a significant portion of his claudication, however with bilateral superficial femoral artery occlusions, he would continue to have some claudication symptoms.  More importantly, with toe pressures of 0, I am concerned that if we wait until a wound develops, he would need both an aortobifemoral bypass and a distal bypass to provide inline flow to the foot for wound healing.    At this point, Ronalee Belts would like to continue conservative management  of his peripheral arterial disease.  He is not ready to pursue aortobifemoral bypass.  My plan is to see him in 3 months to continue our discussion.  He was asked to call my office should any new rest pain, tissue loss, worsening claudication symptoms occur.   Broadus John, MD [facility] [phone] Total time of patient care including pre-visit research, consultation, and documentation greater than 30 minutes

## 2022-09-30 ENCOUNTER — Telehealth: Payer: Self-pay | Admitting: Cardiovascular Disease

## 2022-09-30 NOTE — Telephone Encounter (Signed)
Spoke with pt regarding needing to cancel his upcoming office visit with Dr. Fletcher Anon since he is following up with Dr. Virl Cagey. Pt is also concerned with using preparation H, he states that package warns that if you have heart disease that he should check with his cardiologist before using. Let pt know that I will send a message to one of our clinical pharmacists to make sure it is safe. Pt verbalizes understanding.  Discussed with [facility] Pharm[facility], she states that topical use is ok.   Returned call to pt. He did no answer, left message stating that medication is ok to use topically. Advised pt to call back with questions or concerns.

## 2022-09-30 NOTE — Telephone Encounter (Signed)
Patient calling to see if its okay to use over the counter Preparation H. Please advise

## 2022-10-13 DIAGNOSIS — C61 Malignant neoplasm of prostate: Secondary | ICD-10-CM | POA: Diagnosis not present

## 2022-10-13 DIAGNOSIS — Z5181 Encounter for therapeutic drug level monitoring: Secondary | ICD-10-CM | POA: Diagnosis not present

## 2022-10-13 DIAGNOSIS — Z0000 Encounter for general adult medical examination without abnormal findings: Secondary | ICD-10-CM | POA: Diagnosis not present

## 2022-10-13 DIAGNOSIS — E782 Mixed hyperlipidemia: Secondary | ICD-10-CM | POA: Diagnosis not present

## 2022-10-13 DIAGNOSIS — I10 Essential (primary) hypertension: Secondary | ICD-10-CM | POA: Diagnosis not present

## 2022-10-13 DIAGNOSIS — I739 Peripheral vascular disease, unspecified: Secondary | ICD-10-CM | POA: Diagnosis not present

## 2022-10-13 DIAGNOSIS — R7301 Impaired fasting glucose: Secondary | ICD-10-CM | POA: Diagnosis not present

## 2022-10-13 DIAGNOSIS — I700 Atherosclerosis of aorta: Secondary | ICD-10-CM | POA: Diagnosis not present

## 2022-10-14 ENCOUNTER — Telehealth: Payer: Self-pay | Admitting: Interventional Cardiology

## 2022-10-14 NOTE — Telephone Encounter (Signed)
Pt states he has joined the [facility] and wants to ask Dr. Irish Lack if its okay for him to get in the hot tub. He states it has some warnings on there and he was concerned since he had a stent placed. Please advise.

## 2022-10-15 NOTE — Telephone Encounter (Signed)
Reviewed with Dr Irish Lack and [place] for patient to use hot tub.  I made patient aware.  Patient advised to discontinue use of hot tub if he develops dizziness when using hot tub

## 2022-10-19 ENCOUNTER — Ambulatory Visit: Payer: Medicare Other | Admitting: Cardiovascular Disease

## 2022-10-29 DIAGNOSIS — C61 Malignant neoplasm of prostate: Secondary | ICD-10-CM | POA: Diagnosis not present

## 2022-11-05 DIAGNOSIS — R351 Nocturia: Secondary | ICD-10-CM | POA: Diagnosis not present

## 2022-11-05 DIAGNOSIS — C61 Malignant neoplasm of prostate: Secondary | ICD-10-CM | POA: Diagnosis not present

## 2022-11-05 DIAGNOSIS — R972 Elevated prostate specific antigen [PSA]: Secondary | ICD-10-CM | POA: Diagnosis not present

## 2022-11-05 DIAGNOSIS — N401 Enlarged prostate with lower urinary tract symptoms: Secondary | ICD-10-CM | POA: Diagnosis not present

## 2022-11-17 ENCOUNTER — Telehealth: Payer: Self-pay | Admitting: *Deleted

## 2022-11-17 DIAGNOSIS — I2510 Atherosclerotic heart disease of native coronary artery without angina pectoris: Secondary | ICD-10-CM | POA: Diagnosis not present

## 2022-11-17 DIAGNOSIS — Z7901 Long term (current) use of anticoagulants: Secondary | ICD-10-CM | POA: Diagnosis not present

## 2022-11-17 DIAGNOSIS — Z86010 Personal history of colonic polyps: Secondary | ICD-10-CM | POA: Diagnosis not present

## 2022-11-17 DIAGNOSIS — K649 Unspecified hemorrhoids: Secondary | ICD-10-CM | POA: Diagnosis not present

## 2022-11-17 NOTE — Telephone Encounter (Signed)
   Name: Paul Tyler  DOB: [birth year]  MRN: [id number]  Primary Cardiologist: Larae Grooms, MD   Preoperative team, please contact this patient and set up a phone call appointment for further preoperative risk assessment. Please obtain consent and complete medication review. Thank you for your help.  I confirm that guidance regarding antiplatelet and oral anticoagulation therapy has been completed and, if necessary, noted below.  If necessary his Plavix may be held for 5 days prior to his procedure.  Please resume as soon as hemostasis is achieved.   Deberah Pelton, NP 11/17/2022, 1:07 PM [place]

## 2022-11-17 NOTE — Telephone Encounter (Signed)
I s/w the pt about scheduling a tele appt. Pt said he could not set up a tele appt right now as he was on his way to another an appt. Pt then said he a class at the [facility] and will be back home around 10 am and we can call him back around that time.

## 2022-11-17 NOTE — Telephone Encounter (Signed)
   Pre-operative Risk Assessment    Patient Name: Paul Tyler  DOB: [birth year] MRN: [id number]      Request for Surgical Clearance    Procedure:   COLONOSCOPY  Date of Surgery:  Clearance 12/21/22                                 Surgeon:  DR. Michail Sermon Surgeon's Group or Practice Name:  [facility] Phone number:  [phone] Fax number: [phone]   Type of Clearance Requested:   - Medical  - Pharmacy:  Hold Clopidogrel (Plavix) x 5 DAYS PRIOR   Type of Anesthesia:   PROPOFOL   Additional requests/questions:    Jiles Prows   11/17/2022, 11:06 AM

## 2022-11-18 ENCOUNTER — Telehealth: Payer: Self-pay

## 2022-11-18 NOTE — Telephone Encounter (Signed)
  Patient Consent for Virtual Visit        Paul Tyler has provided verbal consent on 11/18/2022 for a virtual visit (video or telephone).   CONSENT FOR VIRTUAL VISIT FOR:  Paul Tyler  By participating in this virtual visit I agree to the following:  I hereby voluntarily request, consent and authorize [facility] and its employed or contracted physicians, physician assistants, nurse practitioners or other licensed health care professionals (the Practitioner), to provide me with telemedicine health care services (the "Services") as deemed necessary by the treating Practitioner. I acknowledge and consent to receive the Services by the Practitioner via telemedicine. I understand that the telemedicine visit will involve communicating with the Practitioner through live audiovisual communication technology and the disclosure of certain medical information by electronic transmission. I acknowledge that I have been given the opportunity to request an in-person assessment or other available alternative prior to the telemedicine visit and am voluntarily participating in the telemedicine visit.  I understand that I have the right to withhold or withdraw my consent to the use of telemedicine in the course of my care at any time, without affecting my right to future care or treatment, and that the Practitioner or I may terminate the telemedicine visit at any time. I understand that I have the right to inspect all information obtained and/or recorded in the course of the telemedicine visit and may receive copies of available information for a reasonable fee.  I understand that some of the potential risks of receiving the Services via telemedicine include:  Delay or interruption in medical evaluation due to technological equipment failure or disruption; Information transmitted may not be sufficient (e.g. poor resolution of images) to allow for appropriate medical decision making by the  Practitioner; and/or  In rare instances, security protocols could fail, causing a breach of personal health information.  Furthermore, I acknowledge that it is my responsibility to provide information about my medical history, conditions and care that is complete and accurate to the best of my ability. I acknowledge that Practitioner's advice, recommendations, and/or decision may be based on factors not within their control, such as incomplete or inaccurate data provided by me or distortions of diagnostic images or specimens that may result from electronic transmissions. I understand that the practice of medicine is not an exact science and that Practitioner makes no warranties or guarantees regarding treatment outcomes. I acknowledge that a copy of this consent can be made available to me via my patient portal ([facility]), or I can request a printed copy by calling the office of [facility].    I understand that my insurance will be billed for this visit.   I have read or had this consent read to me. I understand the contents of this consent, which adequately explains the benefits and risks of the Services being provided via telemedicine.  I have been provided ample opportunity to ask questions regarding this consent and the Services and have had my questions answered to my satisfaction. I give my informed consent for the services to be provided through the use of telemedicine in my medical care

## 2022-11-18 NOTE — Telephone Encounter (Signed)
Spoke with patient who is agreeable to do a tele visit on 3/13 at 9:20 am. Consent and med rec have been done.

## 2022-11-18 NOTE — Telephone Encounter (Signed)
Pt is returning call to schedule a tele visit for clearance.

## 2022-11-23 NOTE — Progress Notes (Unsigned)
Virtual Visit via Telephone Note   Because of Paul Tyler's co-morbid illnesses, he is at least at moderate risk for complications without adequate follow up.  This format is felt to be most appropriate for this patient at this time.  The patient did not have access to video technology/had technical difficulties with video requiring transitioning to audio format only (telephone).  All issues noted in this document were discussed and addressed.  No physical exam could be performed with this format.  Please refer to the patient's chart for his consent to telehealth for [facility]  Evaluation Performed:  Preoperative cardiovascular risk assessment _____________   Date:  11/23/2022   Patient ID:  BRAYDIN FLAMAND, DOB 08-31-1951, MRN [id number] Patient Location:  Home Provider location:   Office  Primary Care Provider:  Glenis Smoker, MD Primary Cardiologist:  Larae Grooms, MD  Chief Complaint / Patient Profile   72 y.o. y/o male with a h/o HTN, PAD, HLD, prostate cancer who is pending colonoscopy and presents today for telephonic preoperative cardiovascular risk assessment.  History of Present Illness    Paul Tyler is a 72 y.o. male who presents via audio/video conferencing for a telehealth visit today.  Pt was last seen in cardiology clinic on 06/29/2022 by Dr. Cecelia Byars.  At that time BRICYN FOK was doing well with no chest pain and stable dyspnea..  The patient is now pending procedure as outlined above. Since his last visit, he ***  *** denies chest pain, shortness of breath, lower extremity edema, fatigue, palpitations, melena, hematuria, hemoptysis, diaphoresis, weakness, presyncope, syncope, orthopnea, and PND.   If necessary his Plavix may be held for 5 days prior to his procedure. Please resume as soon as hemostasis is achieved.   Past Medical History    Past Medical History:  Diagnosis Date   Adenomatous colon polyp    Anginal pain  ([place])    Anxiety    Coronary artery disease    Hyperlipidemia    Hypertension    Impotence of organic origin    Insomnia, unspecified    Peripheral vascular disease (HCC)    right leg   Prostate cancer (HCC)    Psoriasis    PVD (peripheral vascular disease) (HCC)    Recurrent low back pain    SOB (shortness of breath) on exertion    Past Surgical History:  Procedure Laterality Date   ABDOMINAL AORTOGRAM W/LOWER EXTREMITY N/A 06/09/2022   Procedure: ABDOMINAL AORTOGRAM W/LOWER EXTREMITY;  Surgeon: Wellington Hampshire, MD;  Location: [facility];  Service: Cardiovascular;  Laterality: N/A;   CORONARY ANGIOPLASTY WITH STENT PLACEMENT  04/13/2012   MID LAD   LEFT HEART CATHETERIZATION WITH CORONARY ANGIOGRAM N/A 04/13/2012   Procedure: LEFT HEART CATHETERIZATION WITH CORONARY ANGIOGRAM;  Surgeon: Jettie Booze, MD;  Location: [facility] CATH LAB;  Service: Cardiovascular;  Laterality: N/A;   PROSTATE BIOPSY      Allergies  Allergies  Allergen Reactions   Lipitor [Atorvastatin Calcium] Nausea And Vomiting    Causes dizziness   Zyban [Bupropion Hcl] Rash and Other (See Comments)    Causes joint pain   Zocor [Simvastatin]     Causes muscle pain    Home Medications    Prior to Admission medications   Medication Sig Start Date End Date Taking? Authorizing Provider  ALPRAZolam (XANAX) 0.25 MG tablet Take 0.125-0.25 mg by mouth 3 (three) times daily as needed for anxiety. 04/21/20   [provider]  chlorthalidone (HYGROTON) 25 MG tablet Take 25 mg by mouth every morning. 04/29/21   [provider]  Cholecalciferol (VITAMIN D) 2000 UNITS CAPS Take 2,000 Units by mouth daily.    [provider]  clopidogrel (PLAVIX) 75 MG tablet Take 1 tablet (75 mg total) by mouth daily. 09/02/22   Jettie Booze, MD  losartan (COZAAR) 100 MG tablet Take 1 tablet by mouth once daily 07/19/22   Jettie Booze, MD  nitroGLYCERIN (NITROSTAT) 0.4 MG SL tablet PLACE  ONE TABLET UNDER THE TONGUE EVERY 5 MINUTES AS NEEDED FOR CHEST PAIN. 04/19/22   Jettie Booze, MD  rosuvastatin (CRESTOR) 10 MG tablet Take 10 mg by mouth 4 (four) times a week. Patient not taking: Reported on 11/18/2022    [provider]  rosuvastatin (CRESTOR) 10 MG tablet Take 10 mg by mouth daily.    [provider]  tadalafil (CIALIS) 5 MG tablet Take 5 mg by mouth daily as needed for erectile dysfunction. 03/11/21   [provider]  Witch Hazel (TUCKS MEDICATED COOLING EX) Place 1 Pad rectally as needed (irritation).    [provider]    Physical Exam    Vital Signs:  BURLIE ROSCHER does not have vital signs available for review today.***  Given telephonic nature of communication, physical exam is limited. AAOx3. NAD. Normal affect.  Speech and respirations are unlabored.  Accessory Clinical Findings    None  Assessment & Plan    1.  Preoperative Cardiovascular Risk Assessment: {Click Here to Calculate RCRI      :0000000  { Click Here to Calculate DASI      :[id number] {Select to add RCRI Risk (<1%=LOW; >/=1%=HIGH) (Optional):21036017}  {Select if HIGH (RCRI >/=1%) Risk (Optional):21036030} Recommendations: {2014 ACC/AHA Perioperative Guidelines  :21036001} Antiplatelet and/or Anticoagulation Recommendations: {Antiplatelet Recommendations                  :21036016} {Anticoagulation Recommendations           :[id number]    The patient was advised that if he develops new symptoms prior to surgery to contact our office to arrange for a follow-up visit, and he verbalized understanding.  (Reminder: Include SBE prophylaxis/Antiplatelet/Anticoag Instructions***)  A copy of this note will be routed to requesting surgeon.  Time:   Today, I have spent *** minutes with the patient with telehealth technology discussing medical history, symptoms, and management plan.     Mable Fill, Marissa Nestle, NP  11/23/2022, 1:07 PM

## 2022-11-24 ENCOUNTER — Ambulatory Visit: Payer: Medicare Other | Attending: Cardiovascular Disease

## 2022-11-24 DIAGNOSIS — Z01810 Encounter for preprocedural cardiovascular examination: Secondary | ICD-10-CM | POA: Diagnosis not present

## 2022-12-10 ENCOUNTER — Ambulatory Visit: Payer: Medicare Other | Admitting: Vascular Surgery

## 2022-12-15 DIAGNOSIS — K6289 Other specified diseases of anus and rectum: Secondary | ICD-10-CM | POA: Diagnosis not present

## 2023-01-14 ENCOUNTER — Telehealth: Payer: Self-pay | Admitting: Interventional Cardiology

## 2023-01-14 ENCOUNTER — Other Ambulatory Visit: Payer: Self-pay | Admitting: Interventional Cardiology

## 2023-01-14 MED ORDER — LOSARTAN POTASSIUM 100 MG PO TABS: 100.0000 mg | ORAL_TABLET | Freq: Every day | ORAL | 3 refills | Status: DC

## 2023-01-14 NOTE — Telephone Encounter (Signed)
*  STAT* If patient is at the pharmacy, call can be transferred to refill team.   1. Which medications need to be refilled? (please list name of each medication and dose if known)   losartan (COZAAR) 100 MG tablet   2. Which pharmacy/location (including street and city if local pharmacy) is medication to be sent to?  [facility] - [place] - [address]   3. Do they need a 30 day or 90 day supply?   90  Patient states he has 2 tablets left.

## 2023-01-14 NOTE — Telephone Encounter (Signed)
Pt's medication was sent to pt's pharmacy as requested. Confirmation received.  °

## 2023-01-26 DIAGNOSIS — D122 Benign neoplasm of ascending colon: Secondary | ICD-10-CM | POA: Diagnosis not present

## 2023-01-26 DIAGNOSIS — Z86010 Personal history of colonic polyps: Secondary | ICD-10-CM | POA: Diagnosis not present

## 2023-01-26 DIAGNOSIS — K648 Other hemorrhoids: Secondary | ICD-10-CM | POA: Diagnosis not present

## 2023-01-26 DIAGNOSIS — Z09 Encounter for follow-up examination after completed treatment for conditions other than malignant neoplasm: Secondary | ICD-10-CM | POA: Diagnosis not present

## 2023-01-26 DIAGNOSIS — D120 Benign neoplasm of cecum: Secondary | ICD-10-CM | POA: Diagnosis not present

## 2023-01-26 LAB — HM COLONOSCOPY

## 2023-01-28 DIAGNOSIS — D122 Benign neoplasm of ascending colon: Secondary | ICD-10-CM | POA: Diagnosis not present

## 2023-01-28 DIAGNOSIS — D120 Benign neoplasm of cecum: Secondary | ICD-10-CM | POA: Diagnosis not present

## 2023-02-08 DIAGNOSIS — C61 Malignant neoplasm of prostate: Secondary | ICD-10-CM | POA: Diagnosis not present

## 2023-02-14 ENCOUNTER — Telehealth: Payer: Self-pay

## 2023-02-14 DIAGNOSIS — C61 Malignant neoplasm of prostate: Secondary | ICD-10-CM | POA: Diagnosis not present

## 2023-02-14 DIAGNOSIS — N403 Nodular prostate with lower urinary tract symptoms: Secondary | ICD-10-CM | POA: Diagnosis not present

## 2023-02-14 DIAGNOSIS — R3912 Poor urinary stream: Secondary | ICD-10-CM | POA: Diagnosis not present

## 2023-02-14 DIAGNOSIS — N5201 Erectile dysfunction due to arterial insufficiency: Secondary | ICD-10-CM | POA: Diagnosis not present

## 2023-02-14 NOTE — Telephone Encounter (Signed)
   Pre-operative Risk Assessment    Patient Name: Paul Tyler  DOB: 1951/07/10 MRN: [id number]     Request for Surgical Clearance    Procedure:  Prostate Bx  Date of Surgery:  Clearance TBD                                 Surgeon:  Dr. Bjorn Pippin Surgeon's Group or Practice Name:  [facility]  Phone number:  [phone] Fax number:  [phone]   Type of Clearance Requested:   - Pharmacy:  Hold Clopidogrel (Plavix) 5 days prior    Type of Anesthesia:  None    Additional requests/questions:    Scarlette Shorts   02/14/2023, 5:06 PM

## 2023-02-15 ENCOUNTER — Telehealth: Payer: Self-pay

## 2023-02-15 NOTE — Telephone Encounter (Signed)
Spoke with patient who is agreeable to do a tele visit on 7/15 at 9:20 am. Consent given but med rec needs to be completed.

## 2023-02-15 NOTE — Telephone Encounter (Signed)
  Patient Consent for Virtual Visit        Paul Tyler has provided verbal consent on 02/15/2023 for a virtual visit (video or telephone).   CONSENT FOR VIRTUAL VISIT FOR:  Paul Tyler  By participating in this virtual visit I agree to the following:  I hereby voluntarily request, consent and authorize [facility] and its employed or contracted physicians, physician assistants, nurse practitioners or other licensed health care professionals (the Practitioner), to provide me with telemedicine health care services (the "Services") as deemed necessary by the treating Practitioner. I acknowledge and consent to receive the Services by the Practitioner via telemedicine. I understand that the telemedicine visit will involve communicating with the Practitioner through live audiovisual communication technology and the disclosure of certain medical information by electronic transmission. I acknowledge that I have been given the opportunity to request an in-person assessment or other available alternative prior to the telemedicine visit and am voluntarily participating in the telemedicine visit.  I understand that I have the right to withhold or withdraw my consent to the use of telemedicine in the course of my care at any time, without affecting my right to future care or treatment, and that the Practitioner or I may terminate the telemedicine visit at any time. I understand that I have the right to inspect all information obtained and/or recorded in the course of the telemedicine visit and may receive copies of available information for a reasonable fee.  I understand that some of the potential risks of receiving the Services via telemedicine include:  Delay or interruption in medical evaluation due to technological equipment failure or disruption; Information transmitted may not be sufficient (e.g. poor resolution of images) to allow for appropriate medical decision making by the  Practitioner; and/or  In rare instances, security protocols could fail, causing a breach of personal health information.  Furthermore, I acknowledge that it is my responsibility to provide information about my medical history, conditions and care that is complete and accurate to the best of my ability. I acknowledge that Practitioner's advice, recommendations, and/or decision may be based on factors not within their control, such as incomplete or inaccurate data provided by me or distortions of diagnostic images or specimens that may result from electronic transmissions. I understand that the practice of medicine is not an exact science and that Practitioner makes no warranties or guarantees regarding treatment outcomes. I acknowledge that a copy of this consent can be made available to me via my patient portal [facility] MyChart), or I can request a printed copy by calling the office of [facility].    I understand that my insurance will be billed for this visit.   I have read or had this consent read to me. I understand the contents of this consent, which adequately explains the benefits and risks of the Services being provided via telemedicine.  I have been provided ample opportunity to ask questions regarding this consent and the Services and have had my questions answered to my satisfaction. I give my informed consent for the services to be provided through the use of telemedicine in my medical care

## 2023-02-15 NOTE — Telephone Encounter (Signed)
   Name: Paul Tyler  DOB: 1951/05/01  MRN: [id number]  Primary Cardiologist: Lance Muss, MD   Preoperative team, please contact this patient and set up a phone call appointment for further preoperative risk assessment. Please obtain consent and complete medication review. Thank you for your help.  I confirm that guidance regarding antiplatelet and oral anticoagulation therapy has been completed and, if necessary, noted below.  Per office protocol, he may hold Plavix for 5 days prior to procedure and should resume as soon as hemodynamically stable postoperatively.    Paul Levering, NP 02/15/2023, 11:31 AM [facility]

## 2023-02-28 NOTE — Telephone Encounter (Signed)
  Message Received: Today Monge, Petra Kuba, NP  P Cv Div Preop Callback This is a note from the urologist saying he will need clearance to hold Plavix prior to procedure.  Looks like he is already scheduled for a televisit on 03/28/2023.  Media Information            Document Information  AMB Correspondence    02/15/2023 13:09  Attached To:  [name] "Kathlene November"  Source Information  Default, Provider, MD

## 2023-03-28 ENCOUNTER — Ambulatory Visit: Payer: Medicare Other | Attending: Cardiovascular Disease

## 2023-03-28 DIAGNOSIS — Z01810 Encounter for preprocedural cardiovascular examination: Secondary | ICD-10-CM

## 2023-03-28 NOTE — Progress Notes (Signed)
Virtual Visit via Telephone Note   Because of Paul Tyler's co-morbid illnesses, he is at least at moderate risk for complications without adequate follow up.  This format is felt to be most appropriate for this patient at this time.  The patient did not have access to video technology/had technical difficulties with video requiring transitioning to audio format only (telephone).  All issues noted in this document were discussed and addressed.  No physical exam could be performed with this format.  Please refer to the patient's chart for his consent to telehealth for [facility].  Evaluation Performed:  Preoperative cardiovascular risk assessment _____________   Date:  03/28/2023   Patient ID:  Paul Tyler, DOB 03/12/1951, MRN [id number] Patient Location:  Home Provider location:   Office  Primary Care Provider:  Shon Hale, MD Primary Cardiologist:  Paul Muss, MD  Chief Complaint / Patient Profile   72 y.o. y/o male with a h/o PAD, shortness of breath, hypertension, hyperlipidemia, coronary artery disease who is pending prostate biopsy and presents today for telephonic preoperative cardiovascular risk assessment.  History of Present Illness    Paul Tyler is a 72 y.o. male who presents via audio/video conferencing for a telehealth visit today.  Pt was last seen in cardiology clinic on 06/29/2022 by Paul Tyler.  At that time Paul Tyler was doing well .  The patient is now pending procedure as outlined above. Since his last visit, he remains stable from a cardiac standpoint.  Today he denies chest pain, shortness of breath, lower extremity edema, fatigue, palpitations, melena, hematuria, hemoptysis, diaphoresis, weakness, presyncope, syncope, orthopnea, and PND.   Past Medical History    Past Medical History:  Diagnosis Date   Adenomatous colon polyp    Anginal pain (HCC)    Anxiety    Coronary artery disease    Hyperlipidemia     Hypertension    Impotence of organic origin    Insomnia, unspecified    Peripheral vascular disease (HCC)    right leg   Prostate cancer (HCC)    Psoriasis    PVD (peripheral vascular disease) (HCC)    Recurrent low back pain    SOB (shortness of breath) on exertion    Past Surgical History:  Procedure Laterality Date   ABDOMINAL AORTOGRAM W/LOWER EXTREMITY N/A 06/09/2022   Procedure: ABDOMINAL AORTOGRAM W/LOWER EXTREMITY;  Surgeon: Paul Ouch, MD;  [facility] INV[facility]IVE [facility] LAB;  Service: Cardiovascular;  Laterality: N/A;   CORONARY ANGIOPLASTY WITH STENT PLACEMENT  04/13/2012   MID LAD   LEFT HEART CATHETERIZATION WITH CORONARY ANGIOGRAM N/A 04/13/2012   Procedure: LEFT HEART CATHETERIZATION WITH CORONARY ANGIOGRAM;  Surgeon: Paul Crafts, MD;  Location: [facility] CATH LAB;  Service: Cardiovascular;  Laterality: N/A;   PROSTATE BIOPSY      Allergies  Allergies  Allergen Reactions   Lipitor [Atorvastatin Calcium] Nausea And Vomiting    Causes dizziness   Zyban [Bupropion Hcl] Rash and Other (See Comments)    Causes joint pain   Zocor [Simvastatin]     Causes muscle pain    Home Medications    Prior to Admission medications   Medication Sig Start Date End Date Taking? Authorizing Provider  ALPRAZolam (XANAX) 0.25 MG tablet Take 0.125-0.25 mg by mouth 3 (three) times daily as needed for anxiety. 04/21/20   [provider]  chlorthalidone (HYGROTON) 25 MG tablet Take 25 mg by mouth every morning. 04/29/21   [provider]  Cholecalciferol (VITAMIN D) 2000 UNITS CAPS Take 2,000 Units by mouth daily.    [provider]  clopidogrel (PLAVIX) 75 MG tablet Take 1 tablet (75 mg total) by mouth daily. 09/02/22   Paul Crafts, MD  losartan (COZAAR) 100 MG tablet Take 1 tablet (100 mg total) by mouth daily. 01/14/23   Gaston Islam., NP  nitroGLYCERIN (NITROSTAT) 0.4 MG SL tablet PLACE ONE TABLET UNDER THE TONGUE EVERY 5 MINUTES AS NEEDED FOR  CHEST PAIN. 04/19/22   Paul Crafts, MD  rosuvastatin (CRESTOR) 10 MG tablet Take 10 mg by mouth 4 (four) times a week. Patient not taking: Reported on 11/18/2022    [provider]  rosuvastatin (CRESTOR) 10 MG tablet Take 10 mg by mouth daily.    [provider]  tadalafil (CIALIS) 5 MG tablet Take 5 mg by mouth daily as needed for erectile dysfunction. 03/11/21   [provider]  Witch Hazel (TUCKS MEDICATED COOLING EX) Place 1 Pad rectally as needed (irritation).    [provider]    Physical Exam    Vital Signs:  Paul Tyler does not have vital signs available for review today.  Given telephonic nature of communication, physical exam is limited. AAOx3. NAD. Normal affect.  Speech and respirations are unlabored.  Accessory Clinical Findings    None  Assessment & Plan    1.  Preoperative Cardiovascular Risk Assessment: Prostate biopsy, Paul Tyler, [facility], fax #[phone]      Primary Cardiologist: Paul Muss, MD  Chart reviewed as part of pre-operative protocol coverage. Given past medical history and time since last visit, based on ACC/AHA guidelines, Paul Tyler would be at acceptable risk for the planned procedure without further cardiovascular testing.    His RCRI is a class III risk, 6.6% risk of major cardiac event.  He is able to complete greater than 4 METS of physical activity.  His Plavix may be held for 5 days prior to his procedure.  Please resume as soon as hemostasis is achieved.   Patient was advised that if he develops new symptoms prior to surgery to contact our office to arrange a follow-up appointment.  He verbalized understanding.  I will route this recommendation to the requesting party via Epic fax function and remove from pre-op pool.     Time:   Today, I have spent 5 minutes with the patient with telehealth technology discussing medical history, symptoms, and management  plan.  Prior to his phone evaluation I spent greater than 10 minutes reviewing his past medical history and cardiac medications.   Ronney Asters, NP  03/28/2023, 7:48 AM

## 2023-03-31 DIAGNOSIS — H524 Presbyopia: Secondary | ICD-10-CM | POA: Diagnosis not present

## 2023-04-06 NOTE — Telephone Encounter (Signed)
I refaxed clearance notes to [facility] at [phone]

## 2023-04-06 NOTE — Telephone Encounter (Signed)
[facility] is calling to get update on this clearance. Requesting clearance faxed.

## 2023-04-13 DIAGNOSIS — I10 Essential (primary) hypertension: Secondary | ICD-10-CM | POA: Diagnosis not present

## 2023-04-13 DIAGNOSIS — R7309 Other abnormal glucose: Secondary | ICD-10-CM | POA: Diagnosis not present

## 2023-04-13 DIAGNOSIS — R5383 Other fatigue: Secondary | ICD-10-CM | POA: Diagnosis not present

## 2023-04-13 DIAGNOSIS — E559 Vitamin D deficiency, unspecified: Secondary | ICD-10-CM | POA: Diagnosis not present

## 2023-04-13 DIAGNOSIS — C61 Malignant neoplasm of prostate: Secondary | ICD-10-CM | POA: Diagnosis not present

## 2023-04-13 DIAGNOSIS — I739 Peripheral vascular disease, unspecified: Secondary | ICD-10-CM | POA: Diagnosis not present

## 2023-04-20 DIAGNOSIS — C61 Malignant neoplasm of prostate: Secondary | ICD-10-CM | POA: Diagnosis not present

## 2023-05-18 DIAGNOSIS — R3912 Poor urinary stream: Secondary | ICD-10-CM | POA: Diagnosis not present

## 2023-05-18 DIAGNOSIS — R350 Frequency of micturition: Secondary | ICD-10-CM | POA: Diagnosis not present

## 2023-05-18 DIAGNOSIS — N403 Nodular prostate with lower urinary tract symptoms: Secondary | ICD-10-CM | POA: Diagnosis not present

## 2023-05-18 DIAGNOSIS — C61 Malignant neoplasm of prostate: Secondary | ICD-10-CM | POA: Diagnosis not present

## 2023-05-19 ENCOUNTER — Telehealth: Payer: Self-pay | Admitting: Radiation Oncology

## 2023-05-19 NOTE — Telephone Encounter (Signed)
LVM to schedule CON with Dr. Manning.  

## 2023-05-24 ENCOUNTER — Ambulatory Visit: Payer: Medicare Other | Admitting: Interventional Cardiology

## 2023-05-30 NOTE — Progress Notes (Signed)
Radiation Oncology         [phone] ________________________________  Initial Outpatient Consultation  Name: Paul Tyler MRN: [id number]  Date: 05/31/2023  DOB: 1951/02/24  [id number], Meridee Score, MD  Bjorn Pippin, MD   REFERRING PHYSICIAN: Bjorn Pippin, MD  DIAGNOSIS: 72 y.o. gentleman with Stage T1c adenocarcinoma of the prostate with Gleason score of 3+4, and PSA of 13.7.  No diagnosis found.  HISTORY OF PRESENT ILLNESS: Paul Tyler is a 72 y.o. male with a diagnosis of prostate cancer. We initially met the patient back in 12/2017. In summary, he was diagnosed with low volume Gleason 3+4 prostate cancer on 10/12/17 with a PSA of 5.7. After our discussion with the patient, he opted to proceed with active surveillance. His PSA rose to 8.5 in 08/2020 and remained overall stable through 05/2021. He underwent surveillance biopsies in 08/2018 and 01/2021, both of which were overall stable compared to his initial biopsy. His PSA began to rise again, to 9.21 in 11/2021 and 11.4 in 06/2022. This prompted a surveillance PSMA PET scan on 08/02/22 showing: prostatic adenocarcinoma in left lobe of prostate; no evidence of metastatic adenopathy, visceral metastasis, or skeletal metastasis.   Since that time, his PSA has continued to rise, to 12.1 in 10/2022 and most recently to 13.7 in 02/2023. He underwent surveillance biopsy on 04/20/23. This again showed Gleason 3+4 prostate cancer in 3 out of 12 cores, all on the left side.  The patient reviewed the biopsy results with his urologist and he has kindly been referred today for discussion of potential radiation treatment options.   PREVIOUS RADIATION THERAPY: No  PAST MEDICAL HISTORY:  Past Medical History:  Diagnosis Date   Adenomatous colon polyp    Anginal pain (HCC)    Anxiety    Coronary artery disease    Hyperlipidemia    Hypertension    Impotence of organic origin    Insomnia, unspecified    Peripheral vascular disease (HCC)     right leg   Prostate cancer (HCC)    Psoriasis    PVD (peripheral vascular disease) (HCC)    Recurrent low back pain    SOB (shortness of breath) on exertion       PAST SURGICAL HISTORY: Past Surgical History:  Procedure Laterality Date   ABDOMINAL AORTOGRAM W/LOWER EXTREMITY N/A 06/09/2022   Procedure: ABDOMINAL AORTOGRAM W/LOWER EXTREMITY;  Surgeon: Iran Ouch, MD;  Location: [facility] INVASIVE CV LAB;  Service: Cardiovascular;  Laterality: N/A;   CORONARY ANGIOPLASTY WITH STENT PLACEMENT  04/13/2012   MID LAD   LEFT HEART CATHETERIZATION WITH CORONARY ANGIOGRAM N/A 04/13/2012   Procedure: LEFT HEART CATHETERIZATION WITH CORONARY ANGIOGRAM;  Surgeon: Corky Crafts, MD;  Location: [facility] CATH LAB;  Service: Cardiovascular;  Laterality: N/A;   PROSTATE BIOPSY      FAMILY HISTORY:  Family History  Problem Relation Age of Onset   Prostate cancer Father    Heart disease Father    Diabetes Father    Heart disease Mother    Stroke Mother        On Coumadin   Diabetes Mother        Borderline, no meds   Prostate cancer Maternal Uncle        prostate cancer   Colon cancer Maternal Grandfather    Prostate cancer Maternal Uncle    Heart attack Neg Hx    Hypertension Neg Hx     SOCIAL HISTORY:  Social History   Socioeconomic History  Marital status: Married    Spouse name: [name]   Number of children: 1   Years of education: Not on file   Highest education level: Not on file  Occupational History    Comment: Full time  Tobacco Use   Smoking status: Former    Current packs/day: 0.00    Average packs/day: 1.5 packs/day for 30.0 years (45.0 ttl pk-yrs)    Types: Cigarettes    Start date: 04/17/1981    Quit date: 04/18/2011    Years since quitting: 12.1   Smokeless tobacco: Never   Tobacco comments:    Patient is currently not smoking  Vaping Use   Vaping status: Never Used  Substance and Sexual Activity   Alcohol use: Yes    Alcohol/week: 3.0 - 4.0 standard drinks of  alcohol    Types: 3 - 4 Cans of beer per week    Comment: 3-4/day twice a week   Drug use: No   Sexual activity: Yes  Other Topics Concern   Not on file  Social History Narrative   Resides in [place] with wife, [place]. Continues to work full time.    Social Determinants of Health   Financial Resource Strain: Not on file  Food Insecurity: Not on file  Transportation Needs: Not on file  Physical Activity: Not on file  Stress: Not on file  Social Connections: Not on file  Intimate Partner Violence: Not on file    ALLERGIES: Lipitor [atorvastatin calcium], Zyban [bupropion hcl], and Zocor [simvastatin]  MEDICATIONS:  Current Outpatient Medications  Medication Sig Dispense Refill   ALPRAZolam (XANAX) 0.25 MG tablet Take 0.125-0.25 mg by mouth 3 (three) times daily as needed for anxiety.     chlorthalidone (HYGROTON) 25 MG tablet Take 25 mg by mouth every morning.     Cholecalciferol (VITAMIN D) 2000 UNITS CAPS Take 2,000 Units by mouth daily.     clopidogrel (PLAVIX) 75 MG tablet Take 1 tablet (75 mg total) by mouth daily. 90 tablet 3   losartan (COZAAR) 100 MG tablet Take 1 tablet (100 mg total) by mouth daily. 90 tablet 3   nitroGLYCERIN (NITROSTAT) 0.4 MG SL tablet PLACE ONE TABLET UNDER THE TONGUE EVERY 5 MINUTES AS NEEDED FOR CHEST PAIN. 25 tablet 3   rosuvastatin (CRESTOR) 10 MG tablet Take 10 mg by mouth 4 (four) times a week. (Patient not taking: Reported on 11/18/2022)     rosuvastatin (CRESTOR) 10 MG tablet Take 10 mg by mouth daily.     tadalafil (CIALIS) 5 MG tablet Take 5 mg by mouth daily as needed for erectile dysfunction.     Witch Hazel (TUCKS MEDICATED COOLING EX) Place 1 Pad rectally as needed (irritation).     No current facility-administered medications for this encounter.    REVIEW OF SYSTEMS:  On review of systems, the patient reports that he is doing well overall. He denies any chest pain, shortness of breath, cough, fevers, chills, night sweats, unintended  weight changes. He denies any bowel disturbances, and denies abdominal pain, nausea or vomiting. He denies any new musculoskeletal or joint aches or pains. His IPSS was ***, indicating *** urinary symptoms. His SHIM was ***, indicating he {does not have/has mild/moderate/severe} erectile dysfunction. A complete review of systems is obtained and is otherwise negative.    PHYSICAL EXAM:  Wt Readings from Last 3 Encounters:  09/10/22 208 lb (94.3 kg)  07/30/22 208 lb (94.3 kg)  06/29/22 208 lb 9.6 oz (94.6 kg)   Temp Readings from  Last 3 Encounters:  09/10/22 98.2 F (36.8 C)  07/30/22 98.3 F (36.8 C)  06/09/22 (!) 97.5 F (36.4 C) (Temporal)   BP Readings from Last 3 Encounters:  09/10/22 115/70  07/30/22 (!) 143/71  06/29/22 138/72   Pulse Readings from Last 3 Encounters:  09/10/22 73  07/30/22 77  06/29/22 66    /10  In general this is a well appearing *** male in no acute distress. He's alert and oriented x4 and appropriate throughout the examination. Cardiopulmonary assessment is negative for acute distress, and he exhibits normal effort.     KPS = ***  100 - Normal; no complaints; no evidence of disease. 90   - Able to carry on normal activity; minor signs or symptoms of disease. 80   - Normal activity with effort; some signs or symptoms of disease. 70   - Cares for self; unable to carry on normal activity or to do active work. 60   - Requires occasional assistance, but is able to care for most of his personal needs. 50   - Requires considerable assistance and frequent medical care. 40   - Disabled; requires special care and assistance. 30   - Severely disabled; hospital admission is indicated although death not imminent. 20   - Very sick; hospital admission necessary; active supportive treatment necessary. 10   - Moribund; fatal processes progressing rapidly. 0     - Dead  Karnofsky DA, Abelmann WH, Craver LS and Burchenal [place] [phone]) The use of the nitrogen mustards  in the palliative treatment of carcinoma: with particular reference to bronchogenic carcinoma Cancer 1 634-56  LABORATORY DATA:  Lab Results  Component Value Date   WBC 9.7 05/25/2022   HGB 14.0 05/25/2022   HCT 40.5 05/25/2022   MCV 92 05/25/2022   PLT 234 05/25/2022   Lab Results  Component Value Date   NA 145 (H) 05/25/2022   K 4.8 05/25/2022   CL 102 05/25/2022   CO2 28 05/25/2022   No results found for: "ALT", "AST", "GGT", "ALKPHOS", "BILITOT"   RADIOGRAPHY: No results found.    IMPRESSION/PLAN: 1. 72 y.o. gentleman with Stage T1c adenocarcinoma of the prostate with Gleason Score of 3+4, and PSA of 13.7. We discussed the patient's workup and outlined the nature of prostate cancer in this setting. The patient's T stage, Gleason's score, and PSA put him into the intermediate risk group. Accordingly, he is eligible for a variety of potential treatment options including brachytherapy, 5.5 weeks of external radiation, or prostatectomy. We discussed the available radiation techniques, and focused on the details and logistics of delivery. {The patient may not be an ideal candidate for brachytherapy/boost with a prostate volume of *** prior to downsizing from hormone therapy. We discussed that based on his prostate volume, he would require beginning treatment with a 5 alpha reductase inhibitor +/- ADT for at least 3 months to allow for downsizing of the prostate prior to initiating brachytherapy.} We discussed and outlined the risks, benefits, short and long-term effects associated with radiotherapy and compared and contrasted these with prostatectomy. We discussed the role of SpaceOAR gel in reducing the rectal toxicity associated with radiotherapy. He appears to have a good understanding of his disease and our treatment recommendations which are of curative intent.  He was encouraged to ask questions that were answered to his stated satisfaction.  At the conclusion of our conversation, the  patient is interested in moving forward with ***.  We personally spent *** minutes in  this encounter including chart review, reviewing radiological studies, meeting face-to-face with the patient, entering orders and completing documentation.    Marguarite Arbour, PA-C    Margaretmary Dys, MD  [facility]  Radiation Oncology Direct Dial: [phone]  Fax: [phone] [email or web address]  Skype  [email or web address]   This document serves as a record of services personally performed by Margaretmary Dys, MD and Marcello Fennel, PA-C. It was created on their behalf by Mickie Bail, a trained medical scribe. The creation of this record is based on the scribe's personal observations and the provider's statements to them. This document has been checked and approved by the attending provider.

## 2023-05-31 ENCOUNTER — Ambulatory Visit
Admission: RE | Admit: 2023-05-31 | Discharge: 2023-05-31 | Disposition: A | Discharge status: Home / Self Care | Payer: Medicare Other | Source: Ambulatory Visit | Attending: Radiation Oncology | Admitting: Radiation Oncology

## 2023-05-31 ENCOUNTER — Other Ambulatory Visit: Payer: Self-pay

## 2023-05-31 ENCOUNTER — Telehealth: Payer: Self-pay | Admitting: Gastroenterology

## 2023-05-31 ENCOUNTER — Encounter: Payer: Self-pay | Admitting: Radiation Oncology

## 2023-05-31 VITALS — BP 137/60 | HR 65 | Temp 97.8°F | Resp 18 | Ht 66.0 in | Wt 180.1 lb

## 2023-05-31 DIAGNOSIS — I739 Peripheral vascular disease, unspecified: Secondary | ICD-10-CM | POA: Insufficient documentation

## 2023-05-31 DIAGNOSIS — C61 Malignant neoplasm of prostate: Secondary | ICD-10-CM

## 2023-05-31 DIAGNOSIS — Z79899 Other long term (current) drug therapy: Secondary | ICD-10-CM | POA: Diagnosis not present

## 2023-05-31 DIAGNOSIS — Z7902 Long term (current) use of antithrombotics/antiplatelets: Secondary | ICD-10-CM | POA: Diagnosis not present

## 2023-05-31 DIAGNOSIS — Z800 Family history of malignant neoplasm of digestive organs: Secondary | ICD-10-CM | POA: Insufficient documentation

## 2023-05-31 DIAGNOSIS — I10 Essential (primary) hypertension: Secondary | ICD-10-CM | POA: Diagnosis not present

## 2023-05-31 DIAGNOSIS — Z87891 Personal history of nicotine dependence: Secondary | ICD-10-CM | POA: Insufficient documentation

## 2023-05-31 DIAGNOSIS — I2510 Atherosclerotic heart disease of native coronary artery without angina pectoris: Secondary | ICD-10-CM | POA: Insufficient documentation

## 2023-05-31 DIAGNOSIS — E785 Hyperlipidemia, unspecified: Secondary | ICD-10-CM | POA: Diagnosis not present

## 2023-05-31 DIAGNOSIS — Z191 Hormone sensitive malignancy status: Secondary | ICD-10-CM | POA: Diagnosis not present

## 2023-05-31 NOTE — Progress Notes (Signed)
GU Location of Tumor / Histology: Prostate Ca  If Prostate Cancer, Gleason Score is (3 + 4) and PSA is (13.7)  Biopsies     08/02/2022 Dr.John Annabell Howells [place] PET (PSMA) Skull to Mid Thigh CLINICAL DATA:  Prostate carcinoma with biopsy diagnosis 07/05/2022 with Gleason 3+4=7 adenocarcinoma. PSA equal levin 0.4   IMPRESSION: 1. Prostate adenocarcinoma in the LEFT lobe of the prostate gland. 2. No evidence metastatic adenopathy in the pelvis or periaortic retroperitoneum. 3. No evidence of visceral metastasis or skeletal metastasis. 4.  Aortic Atherosclerosis (ICD10-I70.0).   Past/Anticipated interventions by urology, if any: NA  Past/Anticipated interventions by medical oncology, if any: NA  Weight changes, if any:  No, intended to loose some weight 16-18 pounds.  IPSS:  12 SHIM:  6  Bowel/Bladder complaints, if any:  Urinary frequency, urgency, weak urinary stream, and incomplete emptying.  No bowel issues.  Nausea/Vomiting, if any: No  Pain issues, if any:  0/10  SAFETY ISSUES: Prior radiation? No Pacemaker/ICD? No Possible current pregnancy? Male Is the patient on methotrexate? No  Current Complaints / other details:

## 2023-05-31 NOTE — Progress Notes (Signed)
Introduced myself to the patient as the prostate nurse navigator.  No barriers to care identified at this time.  He is here to discuss his radiation treatment options, and will plan to proceed with brachytherapy in January. RN provided written education on brachytherapy and contact information.

## 2023-05-31 NOTE — Telephone Encounter (Signed)
Inbound call from patient stating he was previous patient of Dr. Marge Duncans. Patient stating he needs to have a "flat polyp" removed. Patient is requesting to transfer to Dr. Meridee Score due to wife having this procedure done previously. Patient states he will have previous records sent over for review.

## 2023-06-02 ENCOUNTER — Telehealth: Payer: Self-pay | Admitting: *Deleted

## 2023-06-02 NOTE — Telephone Encounter (Signed)
Due to my availability of anesthesia services and my overall need, I have not been able to help most of the practices outside of [place] for large colon polyp resections. In this particular case because the patient is a family member (husband) of another patient of mine, I am willing to take this patient's case and attempt an endoscopic resection.  May 2024 colonoscopy [facility] Dr. Bosie Clos One 5 mm polyp in the ascending colon removed with hot snare. 120 mm polyp in the cecum.  Biopsy. Internal hemorrhoids. The examined portion of the ileum was normal.  We still need to get the results of the pathology.  The patient will need to be scheduled for a clinic visit.  The patient can be scheduled for colonoscopy with EMR 90-minute slot, or wait till clinic visit to schedule at his desire.   Corliss Parish, MD [facility] Advanced Endoscopy Office # [phone]

## 2023-06-02 NOTE — Telephone Encounter (Signed)
CALLED PATIENT TO UPDATE, SPOKE WITH PATIENT

## 2023-06-03 NOTE — Telephone Encounter (Signed)
Left message for patient asking for return call.

## 2023-06-06 NOTE — Telephone Encounter (Signed)
Left message

## 2023-06-29 ENCOUNTER — Telehealth: Payer: Self-pay | Admitting: *Deleted

## 2023-06-29 NOTE — Telephone Encounter (Signed)
RETURNED PATIENT'S PHONE CALL, SPOKE WITH PATIENT. ?

## 2023-06-30 NOTE — Telephone Encounter (Signed)
Left message for patient asking for return call.

## 2023-07-13 ENCOUNTER — Other Ambulatory Visit: Payer: Self-pay | Admitting: Urology

## 2023-07-13 ENCOUNTER — Telehealth: Payer: Self-pay | Admitting: Interventional Cardiology

## 2023-07-13 ENCOUNTER — Telehealth: Payer: Self-pay | Admitting: *Deleted

## 2023-07-13 NOTE — Telephone Encounter (Signed)
Pt has been scheduled in office appt with Tereso Newcomer, [facility]  08/23/23 for pre op clearance. Pt also states he is not sure who to choose as a primary cardiologists since Dr. Gloriajean Dell left the practice. I assured the pt that I will put this in my notes to [facility] and he would be great a resource to help choose which cardiologist would be a good fit. Pt thanked me for the help.   I will update all parties involved.

## 2023-07-13 NOTE — Telephone Encounter (Signed)
Pre-operative Risk Assessment    Patient Name: Paul Tyler  DOB: [birth year] MRN: [id number]      Request for Surgical Clearance    Procedure:  Radioactive Seed Implant  Date of Surgery:  Clearance 10-11-23                                 Surgeon:  Dr Bjorn Pippin Surgeon's Group or Practice Name:   Phone number:  [phone] Fax number:  [phone]   Type of Clearance Requested:   - Medical  - Pharmacy:  Hold Clopidogrel (Plavix) for 5 days   Type of Anesthesia:  General    Additional requests/questions:    Rivka Safer   07/13/2023, 2:36 PM

## 2023-07-13 NOTE — Telephone Encounter (Signed)
CALLED PATIENT TO INFORM OF PRE-SEED APPTS. AND IMPLANT DATE, SPOKE WITH PATIENT AND HE IS AWARE OF THESE APPTS. 

## 2023-07-13 NOTE — Telephone Encounter (Signed)
Name: Paul Tyler  DOB: October 17, 1950  MRN: [id number]  Primary Cardiologist: Lance Muss, MD  Chart reviewed as part of pre-operative protocol coverage. Because of Paul Tyler's past medical history and time since last visit, he will require a follow-up in-office visit in order to better assess preoperative cardiovascular risk.  Pre-op covering staff: - Please schedule appointment and call patient to inform them. If patient already had an upcoming appointment within acceptable timeframe, please add "pre-op clearance" to the appointment notes so provider is aware. - Please contact requesting surgeon's office via preferred method (i.e, phone, fax) to inform them of need for appointment prior to surgery.   Napoleon Form, Leodis Rains, NP  07/13/2023, 2:43 PM

## 2023-07-19 NOTE — Telephone Encounter (Signed)
Patient returned call. Patient has been scheduled for 08/03/23 for office with Dr Judie Petit. Patient has been scheduled for Colon +EMR 17/25 @ [facility].

## 2023-07-21 ENCOUNTER — Other Ambulatory Visit: Payer: Self-pay

## 2023-07-21 DIAGNOSIS — Z860100 Personal history of colon polyps, unspecified: Secondary | ICD-10-CM

## 2023-08-03 ENCOUNTER — Ambulatory Visit: Payer: Medicare Other | Admitting: Gastroenterology

## 2023-08-03 ENCOUNTER — Other Ambulatory Visit (INDEPENDENT_AMBULATORY_CARE_PROVIDER_SITE_OTHER): Payer: Medicare Other

## 2023-08-03 ENCOUNTER — Encounter: Payer: Self-pay | Admitting: Gastroenterology

## 2023-08-03 VITALS — BP 140/70 | HR 68 | Ht 64.0 in | Wt 182.1 lb

## 2023-08-03 DIAGNOSIS — Z7902 Long term (current) use of antithrombotics/antiplatelets: Secondary | ICD-10-CM

## 2023-08-03 DIAGNOSIS — K635 Polyp of colon: Secondary | ICD-10-CM

## 2023-08-03 DIAGNOSIS — Z860101 Personal history of adenomatous and serrated colon polyps: Secondary | ICD-10-CM | POA: Diagnosis not present

## 2023-08-03 LAB — CBC
HCT: 43.8 % (ref 39.0–52.0)
Hemoglobin: 15.1 g/dL (ref 13.0–17.0)
MCHC: 34.4 g/dL (ref 30.0–36.0)
MCV: 91.4 fL (ref 78.0–100.0)
Platelets: 246 10*3/uL (ref 150.0–400.0)
RBC: 4.8 Mil/uL (ref 4.22–5.81)
RDW: 12.8 % (ref 11.5–15.5)
WBC: 10.9 10*3/uL — ABNORMAL HIGH (ref 4.0–10.5)

## 2023-08-03 LAB — BASIC METABOLIC PANEL
BUN: 25 mg/dL — ABNORMAL HIGH (ref 6–23)
CO2: 33 meq/L — ABNORMAL HIGH (ref 19–32)
Calcium: 9.6 mg/dL (ref 8.4–10.5)
Chloride: 98 meq/L (ref 96–112)
Creatinine, Ser: 1.07 mg/dL (ref 0.40–1.50)
GFR: 70.46 mL/min (ref >60.00)
Glucose, Bld: 109 mg/dL — ABNORMAL HIGH (ref 70–99)
Potassium: 4.1 meq/L (ref 3.5–5.1)
Sodium: 138 meq/L (ref 135–145)

## 2023-08-03 MED ORDER — NA SULFATE-K SULFATE-MG SULF 17.5-3.13-1.6 GM/177ML PO SOLN: 1.0000 | ORAL | 0 refills | Status: DC

## 2023-08-03 NOTE — Progress Notes (Unsigned)
GASTROENTEROLOGY OUTPATIENT CLINIC VISIT   Primary Care Provider Shon Hale, MD [address] [place] [address] [phone]  Referring Provider Shon Hale, MD [address] [place],  [place] [address] [phone]  Patient Profile: Paul Tyler is a 72 y.o. male with a pmh significant for CAD (on Plavix), PVD, hypertension, hyperlipidemia, prostate cancer (plan for brachytherapy), anxiety, colon polyps, colon polyps (adenoma).  The patient presents to the [facility] for an evaluation and management of problem(s) noted below:  Problem List 1. Cecal polyp   2. Hx of adenomatous colonic polyps   3. Antiplatelet or antithrombotic long-term use    Discussed the use of AI scribe software for clinical note transcription with the patient, who gave verbal consent to proceed.  History of Present Illness This is the patient's first visit to the [facility] GI clinic.  The patient was referred by Dr. Bosie Clos for further management of a precancerous tubular adenoma of the cecum detected during a surveillance colonoscopy earlier this year.  The polyp was described as a flat 20 mm lesion.  The patient was asymptomatic at the time of the colonoscopy, with no reports of GI bleeding (melena/hematochezia), changes in bowel habits, or new anemia.  He has no abdominal pain or discomfort.  I actually took care of his wife who has had issues with colon polyps in the past as well.  Unfortunately, the patient is dealing with prostate cancer, for which he is scheduled to undergo brachytherapy at the end of January.  The patient is currently on Plavix which he has been on for a while in setting of previous CAD and PVD (he has been seen by Vascular in the past but no plans currently for Bypass grafting).  Despite the complexity of his medical conditions, the patient remains active and engaged in his care and has 3 grandchildren who keep him and his wife  moving regularly.   GI Review of Systems Positive as above Negative for dysphagia, odynophagia, pain, nausea, vomiting, change in bowel habits  Review of Systems General: Denies fevers/chills/weight loss unintentionally Cardiovascular: Denies chest pain Pulmonary: Denies shortness of breath Gastroenterological: See HPI Genitourinary: Denies darkened urine Hematological: Denies easy bruising/bleeding Dermatological: Denies jaundice Psychological: Mood is stable   Medications Current Outpatient Medications  Medication Sig Dispense Refill   ALPRAZolam (XANAX) 0.25 MG tablet Take 0.125-0.25 mg by mouth 3 (three) times daily as needed for anxiety.     chlorthalidone (HYGROTON) 25 MG tablet Take 25 mg by mouth every morning.     Cholecalciferol (VITAMIN D) 2000 UNITS CAPS Take 2,000 Units by mouth daily.     clopidogrel (PLAVIX) 75 MG tablet Take 1 tablet (75 mg total) by mouth daily. 90 tablet 3   losartan (COZAAR) 100 MG tablet Take 1 tablet (100 mg total) by mouth daily. 90 tablet 3   Na Sulfate-K Sulfate-Mg Sulf (SUPREP BOWEL PREP KIT) 17.5-3.13-1.6 GM/177ML SOLN Take 1 kit by mouth as directed. For colonoscopy prep 354 mL 0   rosuvastatin (CRESTOR) 10 MG tablet Take 10 mg by mouth daily.     sildenafil (VIAGRA) 100 MG tablet Take 100 mg by mouth as needed.     Witch Hazel (TUCKS MEDICATED COOLING EX) Place 1 Pad rectally as needed (irritation).     nitroGLYCERIN (NITROSTAT) 0.4 MG SL tablet PLACE ONE TABLET UNDER THE TONGUE EVERY 5 MINUTES AS NEEDED FOR CHEST PAIN. (Patient not taking: Reported on 08/03/2023) 25 tablet 3   No current facility-administered  medications for this visit.    Allergies Allergies  Allergen Reactions   Lipitor [Atorvastatin Calcium] Nausea And Vomiting    Causes dizziness   Zyban [Bupropion Hcl] Rash and Other (See Comments)    Causes joint pain   Zocor [Simvastatin]     Causes muscle pain   Pravachol [Pravastatin]     Other Reaction(s): muscle pain     Histories Past Medical History:  Diagnosis Date   Adenomatous colon polyp    Anginal pain (HCC)    Anxiety    Coronary artery disease    Hyperlipidemia    Hypertension    Impotence of organic origin    Insomnia, unspecified    Prostate cancer (HCC)    Psoriasis    PVD (peripheral vascular disease) (HCC)    Recurrent low back pain    SOB (shortness of breath) on exertion    Past Surgical History:  Procedure Laterality Date   ABDOMINAL AORTOGRAM W/LOWER EXTREMITY N/A 06/09/2022   Procedure: ABDOMINAL AORTOGRAM W/LOWER EXTREMITY;  Surgeon: Iran Ouch, MD;  Location: [facility] INVASIVE CV LAB;  Service: Cardiovascular;  Laterality: N/A;   CORONARY ANGIOPLASTY WITH STENT PLACEMENT  04/13/2012   MID LAD   LEFT HEART CATHETERIZATION WITH CORONARY ANGIOGRAM N/A 04/13/2012   Procedure: LEFT HEART CATHETERIZATION WITH CORONARY ANGIOGRAM;  Surgeon: Corky Crafts, MD;  Location: [facility] CATH LAB;  Service: Cardiovascular;  Laterality: N/A;   PROSTATE BIOPSY     TONSILLECTOMY     Social History   Socioeconomic History   Marital status: Married    Spouse name: [name]   Number of children: 1   Years of education: Not on file   Highest education level: Not on file  Occupational History    Comment: Full time   Occupation: retired  Tobacco Use   Smoking status: Former    Current packs/day: 0.00    Average packs/day: 1.5 packs/day for 30.0 years (45.0 ttl pk-yrs)    Types: Cigarettes    Start date: 04/17/1981    Quit date: 04/18/2011    Years since quitting: 12.3   Smokeless tobacco: Never   Tobacco comments:    Patient is currently not smoking  Vaping Use   Vaping status: Every Day   Substances: Nicotine  Substance and Sexual Activity   Alcohol use: Yes    Alcohol/week: 3.0 - 4.0 standard drinks of alcohol    Types: 3 - 4 Cans of beer per week    Comment: 3-4/day twice a week   Drug use: No   Sexual activity: Yes  Other Topics Concern   Not on file  Social History  Narrative   Resides in [place] with wife, [place]. Continues to work full time.    Social Determinants of Health   Financial Resource Strain: Not on file  Food Insecurity: No Food Insecurity (05/31/2023)   Hunger Vital Sign    Worried About Running Out of Food in the Last Year: Never true    Ran Out of Food in the Last Year: Never true  Transportation Needs: No Transportation Needs (05/31/2023)   PRAPARE - Administrator, Civil Service (Medical): No    Lack of Transportation (Non-Medical): No  Physical Activity: Not on file  Stress: Not on file  Social Connections: Not on file  Intimate Partner Violence: Not At Risk (05/31/2023)   Humiliation, Afraid, Rape, and Kick questionnaire    Fear of Current or Ex-Partner: No    Emotionally Abused: No  Physically Abused: No    Sexually Abused: No   Family History  Problem Relation Age of Onset   Heart disease Mother        ?   Stroke Mother        On Coumadin   Diabetes Mother        Borderline, no meds   COPD Mother    Prostate cancer Father    Heart disease Father    Diabetes Father    Prostate cancer Maternal Uncle    Prostate cancer Maternal Uncle    Stomach cancer Maternal Grandmother    Heart attack Neg Hx    Hypertension Neg Hx    Colon cancer Neg Hx    Esophageal cancer Neg Hx    Inflammatory bowel disease Neg Hx    Liver disease Neg Hx    Pancreatic cancer Neg Hx    Rectal cancer Neg Hx    I have reviewed his medical, social, and family history in detail and updated the electronic medical record as necessary.    PHYSICAL EXAMINATION  BP (!) 140/70 (BP Location: Right Arm, Patient Position: Sitting, Cuff Size: Normal)   Pulse 68   Ht 5\' 4"  (1.626 m) Comment: height measured without shoes  Wt 182 lb 2 oz (82.6 kg)   BMI 31.26 kg/m  Wt Readings from Last 3 Encounters:  08/03/23 182 lb 2 oz (82.6 kg)  05/31/23 180 lb 2 oz (81.7 kg)  09/10/22 208 lb (94.3 kg)  GEN: NAD, appears older than stated age,  nontoxic PSYCH: Cooperative, without pressured speech EYE: Conjunctivae pink, sclerae anicteric ENT: MMM CV: Nontachycardic RESP: No audible wheezing GI: NABS, soft, NT/ND, without rebound MSK/EXT: No significant lower extremity edema SKIN: No jaundice NEURO:  Alert & Oriented x 3, no focal deficits   REVIEW OF DATA  I reviewed the following data at the time of this encounter:  GI Procedures and Studies  May 2024 colonoscopy [facility] Dr. Bosie Clos One 5 mm polyp in the ascending colon removed with hot snare. 1, 20 mm polyp in the cecum.  Biopsy. Internal hemorrhoids. The examined portion of the ileum was normal.  Pathology Consistent with adenoma for both cecal biopsy and ascending colon hot snare polypectomy  Laboratory Studies  Reviewed those in epic  Imaging Studies  No relevant studies to review   ASSESSMENT  Mr. Rominger is a 72 y.o. male with a pmh significant for CAD (on Plavix), PVD, hypertension, hyperlipidemia, prostate cancer (plan for brachytherapy), anxiety, colon polyps, colon polyps (adenoma).  The patient is seen today for evaluation and management of:  1. Cecal polyp   2. Hx of adenomatous colonic polyps   3. Antiplatelet or antithrombotic long-term use    The patient is hemodynamically and clinically stable at this time.  Based upon the description and endoscopic pictures I do feel that it is reasonable to pursue an Advanced Polypectomy attempt of the polyp/lesion.  We discussed some of the techniques of advanced polypectomy which include Endoscopic Mucosal Resection, OVESCO Full-Thickness Resection, Endorotor Morcellation, and Tissue Ablation via Fulguration.  We also reviewed images of typical techniques as noted above.  The risks and benefits of endoscopic evaluation were discussed with the patient; these include but are not limited to the risk of perforation, infection, bleeding, missed lesions, lack of diagnosis, severe illness requiring hospitalization,  as well as anesthesia and sedation related illnesses.  During attempts at advanced resection, the risks of bleeding and perforation/leak are increased as opposed to  diagnostic and screening procedures, and that was discussed with the patient as well.   In addition, I explained that with the possible need for piecemeal resection, subsequent short-interval endoscopic evaluation for follow up and potential retreatment of the lesion/area may be necessary.  I did offer, a referral to surgery in order for patient to have opportunity to discuss surgical management/intervention prior to finalizing decision for attempt at endoscopic removal, however, the patient deferred on this.  If, after attempt at removal of the polyp/lesion, it is found that the patient has a complication or that an invasive lesion or malignant lesion is found, or that the polyp/lesion continues to recur, the patient is aware and understands that surgery may still be indicated/required.  All patient questions were answered, to the best of my ability, and the patient agrees to the aforementioned plan of action with follow-up as indicated.   PLAN  Preprocedure labs as outlined below Proceed with scheduled colonoscopy for attempt at advanced resection of cecal adenoma Will obtain approval for Plavix hold 5 days prior to procedure Follow-up to be dictated based on results and resection technique at time of colonoscopy   Orders Placed This Encounter  Procedures   CBC   Basic Metabolic Panel (BMET)   Ambulatory referral to Gastroenterology    New Prescriptions   NA SULFATE-K SULFATE-MG SULF (SUPREP BOWEL PREP KIT) 17.5-3.13-1.6 GM/177ML SOLN    Take 1 kit by mouth as directed. For colonoscopy prep   Modified Medications   No medications on file    Planned Follow Up No follow-ups on file.   Total Time in Face-to-Face and in Coordination of Care for patient including independent/personal interpretation/review of prior testing,  medical history, examination, medication adjustment, communicating results with the patient directly, and documentation within the EHR is 45 minutes.   Corliss Parish, MD [facility] Advanced Endoscopy Office # [phone]

## 2023-08-03 NOTE — Patient Instructions (Addendum)
We have sent the following medications to your pharmacy for you to pick up at your convenience: Suprep   Your provider has requested that you go to the basement level for lab work before leaving today. Press "B" on the elevator. The lab is located at the first door on the left as you exit the elevator.    You have been scheduled for a colonoscopy. Please follow written instructions given to you at your visit today.   Please pick up your prep supplies at the pharmacy within the next 1-3 days.  If you use inhalers (even only as needed), please bring them with you on the day of your procedure.  DO NOT TAKE 7 DAYS PRIOR TO TEST- Trulicity (dulaglutide) Ozempic, Wegovy (semaglutide) Mounjaro (tirzepatide) Bydureon Bcise (exanatide extended release)  DO NOT TAKE 1 DAY PRIOR TO YOUR TEST Rybelsus (semaglutide) Adlyxin (lixisenatide) Victoza (liraglutide) Byetta (exanatide) ___________________________________________________________________________  Due to recent changes in healthcare laws, you may see the results of your imaging and laboratory studies on MyChart before your provider has had a chance to review them.  We understand that in some cases there may be results that are confusing or concerning to you. Not all laboratory results come back in the same time frame and the provider may be waiting for multiple results in order to interpret others.  Please give [place] 48 hours in order for your provider to thoroughly review all the results before contacting the office for clarification of your results.   Thank you for choosing me and [facility].  Dr. Meridee Score

## 2023-08-04 ENCOUNTER — Encounter: Payer: Self-pay | Admitting: Gastroenterology

## 2023-08-04 ENCOUNTER — Telehealth: Payer: Self-pay

## 2023-08-04 NOTE — Telephone Encounter (Signed)
Request for surgical clearance:     Endoscopy Procedure  What type of surgery is being performed?     Colonoscopy +EMR   When is this surgery scheduled?     09/20/2023  What type of clearance is required ?   Pharmacy  Are there any medications that need to be held prior to surgery and how long? Plavix x5 days prior to procedure.   Practice name and name of physician performing surgery?      [facility]-Dr. Lars Mage, CMA   What is your office phone and fax number?      Phone- [phone]  Fax- [phone]  Anesthesia type (None, local, MAC, general) ?       MAC

## 2023-08-08 NOTE — Telephone Encounter (Signed)
Thank you. Will see appointment recommendations. GM

## 2023-08-08 NOTE — Telephone Encounter (Signed)
Pt has appt 08/23/23 with Tereso Newcomer, PAC. Will  update all parties involved.

## 2023-08-23 ENCOUNTER — Ambulatory Visit: Payer: Medicare Other | Attending: Physician Assistant | Admitting: Emergency Medicine

## 2023-08-23 ENCOUNTER — Encounter: Payer: Self-pay | Admitting: Emergency Medicine

## 2023-08-23 VITALS — BP 112/58 | HR 56 | Ht 67.0 in | Wt 184.0 lb

## 2023-08-23 DIAGNOSIS — Z01810 Encounter for preprocedural cardiovascular examination: Secondary | ICD-10-CM

## 2023-08-23 DIAGNOSIS — I10 Essential (primary) hypertension: Secondary | ICD-10-CM

## 2023-08-23 DIAGNOSIS — I2510 Atherosclerotic heart disease of native coronary artery without angina pectoris: Secondary | ICD-10-CM

## 2023-08-23 DIAGNOSIS — I739 Peripheral vascular disease, unspecified: Secondary | ICD-10-CM

## 2023-08-23 DIAGNOSIS — E785 Hyperlipidemia, unspecified: Secondary | ICD-10-CM

## 2023-08-23 DIAGNOSIS — C61 Malignant neoplasm of prostate: Secondary | ICD-10-CM

## 2023-08-23 NOTE — Patient Instructions (Signed)
Medication Instructions:  Your physician recommends that you continue on your current medications as directed. Please refer to the Current Medication list given to you today.  *If you need a refill on your cardiac medications before your next appointment, please call your pharmacy*   Lab Work: None ordered  If you have labs (blood work) drawn today and your tests are completely normal, you will receive your results only by: MyChart Message (if you have MyChart) OR A paper copy in the mail If you have any lab test that is abnormal or we need to change your treatment, we will call you to review the results.   Testing/Procedures: None ordered   Follow-Up: At [facility], you and your health needs are our priority.  As part of our continuing mission to provide you with exceptional heart care, we have created designated Provider Care Teams.  These Care Teams include your primary Cardiologist (physician) and Advanced Practice Providers (APPs -  Physician Assistants and Nurse Practitioners) who all work together to provide you with the care you need, when you need it.  We recommend signing up for the patient portal called "MyChart".  Sign up information is provided on this After Visit Summary.  MyChart is used to connect with patients for Virtual Visits (Telemedicine).  Patients are able to view lab/test results, encounter notes, upcoming appointments, etc.  Non-urgent messages can be sent to your provider as well.   To learn more about what you can do with MyChart, go to [email or web address].    Your next appointment:   6 month(s)  Provider:   Lorine Bears, MD     Other Instructions

## 2023-08-23 NOTE — Progress Notes (Signed)
Cardiology Office Note:    Date:  08/23/2023  ID:  Jeran, Lepage [birth year], MRN [id number] PCP: Shon Hale, MD  [facility] Providers Cardiologist:  Lorine Bears, MD       Patient Profile:      Paul Tyler is a 72 year old male with past medical history of CAD, PAD, hypertension, hyperlipidemia, obesity, prostate cancer.  He had a cardiac catheterization 04/2012 with DES to LAD with 2.75 x 32 Promus, postdilated to 3.48mm. Lexiscan stress performed on 08/28/2020 was a low risk study with no evidence of ischemia and no evidence of previous infarction. He underwent noninvasive vascular studies on 04/16/2022 that showed an ABI of 0.4 on the right and 0.37 on the left with absent toe pressure.  Duplex showed severe right common iliac artery stenosis and occlusion of the right SFA and the left side showed external iliac artery was occluded with longer occlusion of the SFA.  On 06/09/2022 peripheral vascular catheterization performed by Dr. Kirke Corin, which showed on the right side severe calcified stenosis in the common iliac artery, flush occlusion of the SFA with reconstitution distally via collaterals from the profunda and three-vessel runoff below the knee.  On the left there was flush occlusion of the common iliac artery and external neck artery with reconstitution via collaterals in the proximal common femoral artery, flush occlusion of the SFA with reconstitution distally via collaterals from the profunda and three-vessel runoff below the knee.  He was seen by vascular surgery on 09/10/2022 who had recommended aortobifemoral bypass patient not ready to pursue surgery at that time.  Echocardiogram performed 07/13/2022 showing LVEF 70-75%, no RWMA, grade 1 DD, RV SF normal, trivial mitral valve regurgitation.      History of Present Illness:  Discussed the use of AI scribe software for clinical note transcription with the patient, who gave verbal consent to  proceed.  Paul Tyler is a 72 y.o. male who returns for 1 year follow-up and preoperative clearance.  Patient has 2 scheduled surgeries scheduled for 09/2023.  He is scheduled for a colonoscopy on 09/20/2023 and radioactive seed implant on 10/11/2023.  Today, patient notes over the past year, he has made significant lifestyle changes, including losing 26 pounds and participating in water walking classes at the [facility], which have greatly improved his peripheral arterial disease symptoms. He reports being able to walk longer distances without pain and no longer needing assistance while grocery shopping. However, he acknowledges that his condition will not improve further without bypass surgery, which he is not willing to undergo at this time. He notes that he has made major dietary changes where he is eating less food and eating a lot of fish and salads.  He does note that his prostate cancer has been under observation for the past 4 to 5 years however his PSA has been trending up so that is why he decided to pursue the radioactive seed implant.  He denies chest pain, shortness of breath, lower extremity edema, fatigue, palpitations, melena, hematuria, hemoptysis, diaphoresis, weakness, presyncope, syncope, orthopnea, and PND.      Review of Systems  Constitutional: Negative for weight gain and weight loss.  Cardiovascular:  Negative for chest pain, claudication, cyanosis, dyspnea on exertion, irregular heartbeat, leg swelling, near-syncope, orthopnea, palpitations, paroxysmal nocturnal dyspnea and syncope.  Respiratory:  Negative for cough, hemoptysis and shortness of breath.   Gastrointestinal:  Negative for abdominal pain, hematochezia and melena.  Genitourinary:  Negative for hematuria.  Neurological:  Negative for  dizziness, headaches and light-headedness.     See HPI    Studies Reviewed:   EKG Interpretation Date/Time:  Tuesday August 23 2023 09:01:54 EST Ventricular Rate:  54 PR  Interval:  130 QRS Duration:  112 QT Interval:  474 QTC Calculation: 449 R Axis:   7  Text Interpretation: Sinus bradycardia Incomplete right bundle branch block Confirmed by Rise Paganini [id number]) on 08/23/2023 9:21:53 AM    Echocardiogram 07/13/2022 1. Left ventricular ejection fraction, by estimation, is 70 to 75%. Left  ventricular ejection fraction by 3D volume is 71 %. The left ventricle has  hyperdynamic function. The left ventricle has no regional wall motion  abnormalities. Left ventricular  diastolic parameters are consistent with Grade I diastolic dysfunction  (impaired relaxation).   2. Right ventricular systolic function is normal. The right ventricular  size is normal. The estimated right ventricular systolic pressure is 7.8  mmHg.   3. The mitral valve is grossly normal. Trivial mitral valve  regurgitation.   4. The aortic valve is tricuspid. Aortic valve regurgitation is not  visualized. Aortic valve sclerosis is present, with no evidence of aortic  valve stenosis.   5. The inferior vena cava is normal in size with greater than 50%  respiratory variability, suggesting right atrial pressure of 3 mmHg.   Peripheral vascular catheterization 06/09/2022 1.  Heavily calcified vessels overall. 2.  Right lower extremity: Severe heavily calcified stenosis in the common iliac artery, flush occlusion of the SFA with reconstitution distally via collaterals from the profunda and three-vessel runoff below the knee. 3. Left lower extremity: Flush occlusion of the common iliac artery/external iliac artery with reconstitution via collaterals in the proximal common femoral artery, flush occlusion of the SFA with reconstitution distally via collaterals from the profunda and three-vessel runoff below the knee.   Recommendations: Given heavy calcifications and long occlusion of the left iliac arteries on the left side, I think the best option is aortobifemoral bypass.   Lexiscan stress  08/28/2020 Nuclear stress EF: 72%. The left ventricular ejection fraction is hyperdynamic (>65%). There was no ST segment deviation noted during stress. This is a low risk study. There is no evidence of ischemia and no evidence of previous infarction. The study is normal. Risk Assessment/Calculations:             Physical Exam:   VS:  BP (!) 112/58 (BP Location: Right Arm, Patient Position: Sitting, Cuff Size: Large)   Pulse (!) 56   Ht 5\' 7"  (1.702 m)   Wt 184 lb (83.5 kg)   SpO2 97%   BMI 28.82 kg/m    Wt Readings from Last 3 Encounters:  08/23/23 184 lb (83.5 kg)  08/03/23 182 lb 2 oz (82.6 kg)  05/31/23 180 lb 2 oz (81.7 kg)    Constitutional:      Appearance: Normal and healthy appearance.  Neck:     Vascular: JVD normal.  Pulmonary:     Effort: Pulmonary effort is normal.     Breath sounds: Normal breath sounds.  Chest:     Chest wall: Not tender to palpatation.  Cardiovascular:     PMI at left midclavicular line. Normal rate. Regular rhythm. Normal S1. Normal S2.      Murmurs: There is no murmur.     No gallop.  No click. No rub.  Pulses:    Intact distal pulses.  Edema:    Peripheral edema absent.  Musculoskeletal: Normal range of motion.     Cervical back:  Normal range of motion and neck supple. Skin:    General: Skin is warm and dry.  Neurological:     General: No focal deficit present.     Mental Status: Alert and oriented to person, place and time.  Psychiatric:        Behavior: Behavior is cooperative.        Assessment and Plan:  Coronary artery disease -Cath 04/2012 with DES to LAD -Lexiscan 08/28/2020 was a low risk study with no evidence of ischemia -Echocardiogram 07/13/2022 unremarkable -EKG today SB w/ RBBB -Stable with no anginal symptoms, no indication for ischemic evaluation   Peripheral arterial disease -Peripheral vascular catheterization 06/09/2022 showing heavy calcified vessels overall as noted above.  Vascular surgery recommended  aortobifemoral bypass.  Last seen by vascular 09/10/2022 where patient chose to continue conservative management and not pursue aortobifemoral bypass at that time -Claudication much improved with weight loss and water walking exercises. Patient declined bypass surgery and is currently not interested in follow-up with vascular surgery.  His symptoms are no longer lifestyle limiting -Encourage continuation of weight loss and exercise regimen -Consider follow-up with vascular surgery in the future -Continue Plavix 75 mg once daily, rosuvastatin 10 mg once daily  Hypertension -BP today 112/58.  Excellent control -Continue losartan 100 mg once daily, chlorthalidone 25 mg once daily  Hyperlipidemia -LDL 84 on 10/13/22.  Which is above his goal of less than 70.  He is not interested in medication adjustments at this time and prefers to have his lipid panel drawn at his yearly physical in January 2025 -With his weight loss over the past year and healthy eating habits I suspect LDL cholesterol has decreased -Recommend PCP to send lipid panel results -Continue Rosuvastatin 10mg  daily, no myalgias  Prostate Cancer -Under observation for 5 years with slowly increasing PSA. Patient has elected to proceed with radioactive seed implant. -Continue monitoring PSA levels.  Preoperative cardiovascular examination -Scheduled for colonoscopy on 09/20/2023 w/ Dr. Meridee Score and radioactive seed implant on 10/11/2023 with Dr. Annabell Howells -According to the Revised Cardiac Risk Index (RCRI), his Perioperative Risk of Major Cardiac Event is (%): 6.6. His Functional Capacity in METs is: 6.61 according to the Duke Activity Status Index (DASI). Therefore, based on ACC/AHA guidelines, patient would be at acceptable risk for the planned procedure without further cardiovascular testing. I will route this recommendation to the requesting party via Epic fax function.   Antiplatelet and/or Anticoagulation Recommendations:  -His Plavix  may be held for 5 days prior to his procedure. Please resume as soon as hemostasis is achieved.   Patient needs to establish care with a new cardiologist following departure of previous cardiologist.           Dispo:  Return in about 6 months (around 02/21/2024).  Signed, Denyce Robert, NP

## 2023-08-26 ENCOUNTER — Other Ambulatory Visit: Payer: Self-pay | Admitting: Interventional Cardiology

## 2023-08-30 NOTE — Telephone Encounter (Signed)
Thanks

## 2023-08-30 NOTE — Telephone Encounter (Signed)
FYI:  Plavix may be held for 5 days prior to his procedure. Please resume as soon as hemostasis is achieved Per Cardiology. Patient has been informed and voiced understanding.

## 2023-09-09 ENCOUNTER — Encounter (HOSPITAL_COMMUNITY): Payer: Self-pay | Admitting: Gastroenterology

## 2023-09-09 NOTE — Progress Notes (Signed)
Attempted to obtain medical history for pre op call via telephone, unable to reach at this time. HIPAA compliant voicemail message left requesting return call to pre surgical testing department.

## 2023-09-19 ENCOUNTER — Telehealth: Payer: Self-pay | Admitting: Gastroenterology

## 2023-09-19 NOTE — Telephone Encounter (Signed)
 Inbound call from patient stating he has questions regarding colonoscopy scheduled for tomorrow. Please advise, thank you.

## 2023-09-19 NOTE — Telephone Encounter (Signed)
 The pt wanted to confirm he is having a complete colon and not a flex. I did confirm that the procedure is a full colon. There are no further questions at this time.

## 2023-09-20 ENCOUNTER — Ambulatory Visit (HOSPITAL_COMMUNITY): Payer: Medicare Other

## 2023-09-20 ENCOUNTER — Other Ambulatory Visit: Payer: Self-pay

## 2023-09-20 ENCOUNTER — Encounter (HOSPITAL_COMMUNITY)
Admission: RE | Disposition: A | Discharge status: Home / Self Care | Payer: Self-pay | Source: Home / Self Care | Attending: Gastroenterology

## 2023-09-20 ENCOUNTER — Ambulatory Visit (HOSPITAL_COMMUNITY)
Admission: RE | Admit: 2023-09-20 | Discharge: 2023-09-20 | Disposition: A | Discharge status: Home / Self Care | Payer: Medicare Other | Attending: Gastroenterology | Admitting: Gastroenterology

## 2023-09-20 ENCOUNTER — Encounter (HOSPITAL_COMMUNITY): Payer: Self-pay | Admitting: Gastroenterology

## 2023-09-20 DIAGNOSIS — Z87891 Personal history of nicotine dependence: Secondary | ICD-10-CM | POA: Diagnosis not present

## 2023-09-20 DIAGNOSIS — K5730 Diverticulosis of large intestine without perforation or abscess without bleeding: Secondary | ICD-10-CM | POA: Diagnosis not present

## 2023-09-20 DIAGNOSIS — K635 Polyp of colon: Secondary | ICD-10-CM | POA: Diagnosis not present

## 2023-09-20 DIAGNOSIS — D120 Benign neoplasm of cecum: Secondary | ICD-10-CM | POA: Insufficient documentation

## 2023-09-20 DIAGNOSIS — F17290 Nicotine dependence, other tobacco product, uncomplicated: Secondary | ICD-10-CM | POA: Diagnosis not present

## 2023-09-20 DIAGNOSIS — Z860100 Personal history of colon polyps, unspecified: Secondary | ICD-10-CM | POA: Diagnosis not present

## 2023-09-20 DIAGNOSIS — I739 Peripheral vascular disease, unspecified: Secondary | ICD-10-CM | POA: Diagnosis not present

## 2023-09-20 DIAGNOSIS — I10 Essential (primary) hypertension: Secondary | ICD-10-CM | POA: Insufficient documentation

## 2023-09-20 DIAGNOSIS — K644 Residual hemorrhoidal skin tags: Secondary | ICD-10-CM | POA: Insufficient documentation

## 2023-09-20 DIAGNOSIS — I2510 Atherosclerotic heart disease of native coronary artery without angina pectoris: Secondary | ICD-10-CM | POA: Insufficient documentation

## 2023-09-20 DIAGNOSIS — K641 Second degree hemorrhoids: Secondary | ICD-10-CM

## 2023-09-20 DIAGNOSIS — D123 Benign neoplasm of transverse colon: Secondary | ICD-10-CM | POA: Diagnosis not present

## 2023-09-20 HISTORY — PX: HEMOSTASIS CLIP PLACEMENT: SHX6857

## 2023-09-20 HISTORY — PX: ENDOSCOPIC MUCOSAL RESECTION: SHX6839

## 2023-09-20 HISTORY — PX: COLONOSCOPY WITH PROPOFOL: SHX5780

## 2023-09-20 HISTORY — PX: POLYPECTOMY: SHX5525

## 2023-09-20 HISTORY — DX: Sleep apnea, unspecified: G47.30

## 2023-09-20 HISTORY — PX: SUBMUCOSAL LIFTING INJECTION: SHX6855

## 2023-09-20 SURGERY — COLONOSCOPY WITH PROPOFOL
Anesthesia: Monitor Anesthesia Care

## 2023-09-20 MED ORDER — PROPOFOL 500 MG/50ML IV EMUL
INTRAVENOUS | Status: DC | PRN
  Administered 2023-09-20: 125 ug/kg/min via INTRAVENOUS

## 2023-09-20 MED ORDER — SODIUM CHLORIDE 0.9 % IV SOLN: INTRAVENOUS | Status: DC

## 2023-09-20 MED ORDER — PROPOFOL 1000 MG/100ML IV EMUL
INTRAVENOUS | Status: AC
  Filled 2023-09-20: qty 100

## 2023-09-20 MED ORDER — SODIUM CHLORIDE 0.9 % IV SOLN: INTRAVENOUS | Status: DC | PRN

## 2023-09-20 MED ORDER — PROPOFOL 10 MG/ML IV BOLUS
INTRAVENOUS | Status: DC | PRN
  Administered 2023-09-20: 30 mg via INTRAVENOUS
  Administered 2023-09-20: 20 mg via INTRAVENOUS

## 2023-09-20 MED ORDER — PROPOFOL 500 MG/50ML IV EMUL
INTRAVENOUS | Status: AC
Start: 2023-09-20 — End: ?
  Filled 2023-09-20: qty 50

## 2023-09-20 MED ORDER — LIDOCAINE 2% (20 MG/ML) 5 ML SYRINGE
INTRAMUSCULAR | Status: DC | PRN
  Administered 2023-09-20: 80 mg via INTRAVENOUS

## 2023-09-20 SURGICAL SUPPLY — 20 items

## 2023-09-20 NOTE — Transfer of Care (Signed)
 Immediate Anesthesia Transfer of Care Note  Patient: Paul Tyler  Procedure(s) Performed: COLONOSCOPY WITH PROPOFOL  ENDOSCOPIC MUCOSAL RESECTION SUBMUCOSAL LIFTING INJECTION HEMOSTASIS CLIP PLACEMENT POLYPECTOMY  Patient Location: PACU  Anesthesia Type:MAC  Level of Consciousness: awake, alert , and oriented  Airway & Oxygen Therapy: Patient Spontanous Breathing and Patient connected to face mask oxygen  Post-op Assessment: Report given to RN and Post -op Vital signs reviewed and stable  Post vital signs: Reviewed and stable  Last Vitals:  Vitals Value Taken Time  BP    Temp    Pulse    Resp    SpO2      Last Pain:  Vitals:   09/20/23 0913  TempSrc: Temporal  PainSc: 0-No pain         Complications: No notable events documented.

## 2023-09-20 NOTE — H&P (Signed)
 GASTROENTEROLOGY PROCEDURE H&P NOTE   Primary Care Physician: Chrystal [name], MD  HPI: Paul Tyler is a 73 y.o. male who presents for Colonoscopy for attempt at cecal polyp resection.  Past Medical History:  Diagnosis Date   Adenomatous colon polyp    Anginal pain (HCC)    Anxiety    Coronary artery disease    Hyperlipidemia    Hypertension    Impotence of organic origin    Insomnia, unspecified    Prostate cancer (HCC)    Psoriasis    PVD (peripheral vascular disease) (HCC)    Recurrent low back pain    Sleep apnea    Diagnosed A very long time ago. Does not use CPAP.   SOB (shortness of breath) on exertion    Past Surgical History:  Procedure Laterality Date   ABDOMINAL AORTOGRAM W/LOWER EXTREMITY N/A 06/09/2022   Procedure: ABDOMINAL AORTOGRAM W/LOWER EXTREMITY;  Surgeon: Darron Deatrice LABOR, MD;  Location: [facility] INVASIVE CV LAB;  Service: Cardiovascular;  Laterality: N/A;   CORONARY ANGIOPLASTY WITH STENT PLACEMENT  04/13/2012   MID LAD   LEFT HEART CATHETERIZATION WITH CORONARY ANGIOGRAM N/A 04/13/2012   Procedure: LEFT HEART CATHETERIZATION WITH CORONARY ANGIOGRAM;  Surgeon: Candyce RAMAN Reek, MD;  Location: [facility] CATH LAB;  Service: Cardiovascular;  Laterality: N/A;   PROSTATE BIOPSY     TONSILLECTOMY     No current facility-administered medications for this encounter.   No current facility-administered medications for this encounter. Allergies  Allergen Reactions   Lipitor [Atorvastatin Calcium ] Nausea And Vomiting    Causes dizziness   Zyban [Bupropion Hcl] Rash and Other (See Comments)    Causes joint pain   Zocor [Simvastatin]     Causes muscle pain   Pravachol [Pravastatin]     Other Reaction(s): muscle pain   Family History  Problem Relation Age of Onset   Heart disease Mother        ?   Stroke Mother        On Coumadin   Diabetes Mother        Borderline, no meds   COPD Mother    Prostate cancer Father    Heart disease Father     Diabetes Father    Prostate cancer Maternal Uncle    Prostate cancer Maternal Uncle    Stomach cancer Maternal Grandmother    Heart attack Neg Hx    Hypertension Neg Hx    Colon cancer Neg Hx    Esophageal cancer Neg Hx    Inflammatory bowel disease Neg Hx    Liver disease Neg Hx    Pancreatic cancer Neg Hx    Rectal cancer Neg Hx    Social History   Socioeconomic History   Marital status: Married    Spouse name: Particia   Number of children: 1   Years of education: Not on file   Highest education level: Not on file  Occupational History    Comment: Full time   Occupation: retired  Tobacco Use   Smoking status: Former    Current packs/day: 0.00    Average packs/day: 1.5 packs/day for 30.0 years (45.0 ttl pk-yrs)    Types: Cigarettes    Start date: 04/17/1981    Quit date: 04/18/2011    Years since quitting: 12.4   Smokeless tobacco: Never   Tobacco comments:    Patient is currently not smoking  Vaping Use   Vaping status: Every Day   Substances: Nicotine  Substance and Sexual Activity  Alcohol use: Yes    Alcohol/week: 3.0 - 4.0 standard drinks of alcohol    Types: 3 - 4 Cans of beer per week    Comment: 3-4/day twice a week   Drug use: No   Sexual activity: Yes  Other Topics Concern   Not on file  Social History Narrative   Resides in [place] with wife, [place]. Continues to work full time.    Social Drivers of Corporate Investment Banker Strain: Not on file  Food Insecurity: No Food Insecurity (05/31/2023)   Hunger Vital Sign    Worried About Running Out of Food in the Last Year: Never true    Ran Out of Food in the Last Year: Never true  Transportation Needs: No Transportation Needs (05/31/2023)   PRAPARE - Administrator, Civil Service (Medical): No    Lack of Transportation (Non-Medical): No  Physical Activity: Not on file  Stress: Not on file  Social Connections: Not on file  Intimate Partner Violence: Not At Risk (05/31/2023)   Humiliation,  Afraid, Rape, and Kick questionnaire    Fear of Current or Ex-Partner: No    Emotionally Abused: No    Physically Abused: No    Sexually Abused: No    Physical Exam: Today's Vitals   09/19/23 1047 09/20/23 0913  BP:  (!) 151/67  Pulse:  62  Resp:  10  Temp:  98.1 F (36.7 C)  TempSrc:  Temporal  SpO2:  100%  Weight: 83.5 kg 80.4 kg  Height:  5' 5 (1.651 m)  PainSc:  0-No pain   Body mass index is 29.49 kg/m. GEN: NAD EYE: Sclerae anicteric ENT: MMM CV: Non-tachycardic GI: Soft, NT/ND NEURO:  Alert & Oriented x 3  Lab Results: No results for input(s): WBC, HGB, HCT, PLT in the last 72 hours. BMET No results for input(s): NA, K, CL, CO2, GLUCOSE, BUN, CREATININE, CALCIUM  in the last 72 hours. LFT No results for input(s): PROT, ALBUMIN, AST, ALT, ALKPHOS, BILITOT, BILIDIR, IBILI in the last 72 hours. PT/INR No results for input(s): LABPROT, INR in the last 72 hours.   Impression / Plan: This is a 72 y.o.male who presents for Colonoscopy for attempt at cecal polyp resection.  The risks and benefits of endoscopic evaluation/treatment were discussed with the patient and/or family; these include but are not limited to the risk of perforation, infection, bleeding, missed lesions, lack of diagnosis, severe illness requiring hospitalization, as well as anesthesia and sedation related illnesses.  The patient's history has been reviewed, patient examined, no change in status, and deemed stable for procedure.  The patient and/or family is agreeable to proceed.    Aloha Finner, MD [facility] Advanced [facility]oscopy Office # [phone]

## 2023-09-20 NOTE — Op Note (Addendum)
 [facility] Patient Name: Paul Tyler Procedure Date: 09/20/2023 MRN: [id number] Attending MD: Aloha Finner , MD, [id number] Date of Birth: 1951/06/26 CSN: [id number] Age: 73 Admit Type: Outpatient Procedure:                Colonoscopy Indications:              Excision of colonic polyp Providers:                Aloha Finner, MD, Burnard Fire RN, RN,                            Haskel Chris, Technician Referring MD:              Medicines:                Monitored Anesthesia Care Complications:            No immediate complications. Estimated Blood Loss:     Estimated blood loss was minimal. Procedure:                Pre-Anesthesia Assessment:                           - Prior to the procedure, a History and Physical                            was performed, and patient medications and                            allergies were reviewed. The patient's tolerance of                            previous anesthesia was also reviewed. The risks                            and benefits of the procedure and the sedation                            options and risks were discussed with the patient.                            All questions were answered, and informed consent                            was obtained. Prior Anticoagulants: The patient has                            taken Plavix  (clopidogrel ), last dose was 5 days                            prior to procedure. ASA Grade Assessment: III - A                            patient with severe systemic disease. After  reviewing the risks and benefits, the patient was                            deemed in satisfactory condition to undergo the                            procedure.                           After obtaining informed consent, the colonoscope                            was passed under direct vision. Throughout the                            procedure, the patient's blood  pressure, pulse, and                            oxygen saturations were monitored continuously. The                            CF-HQ190L ([id number] Olympus colonoscope was                            introduced through the anus and advanced to the 3                            cm into the ileum. The colonoscopy was performed                            without difficulty. The patient tolerated the                            procedure. The quality of the bowel preparation was                            good. The terminal ileum, ileocecal valve,                            appendiceal orifice, and rectum were photographed. Scope In: 9:56:03 AM Scope Out: 10:14:27 AM Scope Withdrawal Time: 0 hours 16 minutes 1 second  Total Procedure Duration: 0 hours 18 minutes 24 seconds  Findings:      The digital rectal exam findings include hemorrhoids. Pertinent       negatives include no palpable rectal lesions.      Skin tags were found on perianal exam.      The terminal ileum and ileocecal valve appeared normal.      A 22 mm polyp was found in the cecum. The polyp was sessile.       Preparations were made for attempt at mucosal resection. Demarcation of       the lesion was performed with high-definition white light and narrow       band imaging to clearly identify the boundaries of the lesion. Endoclot       was injected to raise the lesion. Piecemeal  cold mucosal resection using       a snare was performed. Resection and retrieval were complete. Resected       tissue margins were examined and clear of polyp tissue, as we took extra       tissue surrounding the lesion. To prevent bleeding after mucosal       resection, two hemostatic clips were successfully placed (MR       conditional). Clip manufacturer: [employer]. There was no       bleeding at the end of the procedure.      A 2 mm polyp was found in the transverse colon. The polyp was sessile.       The polyp was removed with a cold  snare. Resection and retrieval were       complete.      Multiple small-mouthed diverticula were found in the recto-sigmoid colon       and sigmoid colon.      Normal mucosa was found in the entire colon otherwise.      Non-bleeding non-thrombosed external and internal hemorrhoids were found       during retroflexion, during perianal exam and during digital exam. The       hemorrhoids were Grade II (internal hemorrhoids that prolapse but reduce       spontaneously). Impression:               - Hemorrhoids found on digital rectal exam.                            Perianal skin tags found on perianal exam.                           - The examined portion of the ileum was normal.                           - One 22 mm polyp in the cecum, removed with cold                            piecemeal mucosal resection. Resected and                            retrieved. Clips (MR conditional) were placed. Clip                            manufacturer: [employer].                           - One 2 mm polyp in the transverse colon, removed                            with a cold snare. Resected and retrieved.                           - Diverticulosis in the recto-sigmoid colon and in                            the sigmoid colon.                           -  Normal mucosa in the entire examined colon                            otherwise.                           - Non-bleeding non-thrombosed external and internal                            hemorrhoids. Moderate Sedation:      Not Applicable - Patient had care per Anesthesia. Recommendation:           - The patient will be observed post-procedure,                            until all discharge criteria are met.                           - Discharge patient to home.                           - Patient has a contact number available for                            emergencies. The signs and symptoms of potential                            delayed  complications were discussed with the                            patient. Return to normal activities tomorrow.                            Written discharge instructions were provided to the                            patient.                           - High fiber diet.                           - Use FiberCon 1-2 tablets PO daily.                           - Plavix  restart on 1/9 to decrease risk of                            post-interventional bleeding.                           - Continue present medications.                           - Await pathology results.                           -  Repeat colonoscopy in 1 year for surveillance.                           - The findings and recommendations were discussed                            with the patient.                           - The findings and recommendations were discussed                            with the patient's family. Procedure Code(s):        --- Professional ---                           [phone], Colonoscopy, flexible; with endoscopic                            mucosal resection                           45385, 59, Colonoscopy, flexible; with removal of                            tumor(s), polyp(s), or other lesion(s) by snare                            technique Diagnosis Code(s):        --- Professional ---                           K64.1, Second degree hemorrhoids                           D12.0, Benign neoplasm of cecum                           D12.3, Benign neoplasm of transverse colon (hepatic                            flexure or splenic flexure)                           K63.5, Polyp of colon                           K57.30, Diverticulosis of large intestine without                            perforation or abscess without bleeding CPT copyright 2022 [employer]. All rights reserved. The codes documented in this report are preliminary and upon coder review may  be revised to meet current  compliance requirements. Aloha Finner, MD 09/20/2023 10:26:19 AM Number of Addenda: 0

## 2023-09-20 NOTE — Discharge Instructions (Addendum)
 You may resume your Plavix  in 2 days (Thursday 09/21/22).  YOU HAD AN ENDOSCOPIC PROCEDURE TODAY: Refer to the procedure report and other information in the discharge instructions given to you for any specific questions about what was found during the examination. If this information does not answer your questions, please call [place] office at [phone] to clarify.   YOU SHOULD EXPECT: Some feelings of bloating in the abdomen. Passage of more gas than usual. Walking can help get rid of the air that was put into your GI tract during the procedure and reduce the bloating. If you had a lower endoscopy (such as a colonoscopy or flexible sigmoidoscopy) you may notice spotting of blood in your stool or on the toilet paper. Some abdominal soreness may be present for a day or two, also.  DIET: Your first meal following the procedure should be a light meal and then it is ok to progress to your normal diet. A half-sandwich or bowl of soup is an example of a good first meal. Heavy or fried foods are harder to digest and may make you feel nauseous or bloated. Drink plenty of fluids but you should avoid alcoholic beverages for 24 hours. If you had a esophageal dilation, please see attached instructions for diet.    ACTIVITY: Your care partner should take you home directly after the procedure. You should plan to take it easy, moving slowly for the rest of the day. You can resume normal activity the day after the procedure however YOU SHOULD NOT DRIVE, use power tools, machinery or perform tasks that involve climbing or major physical exertion for 24 hours (because of the sedation medicines used during the test).   SYMPTOMS TO REPORT IMMEDIATELY: A gastroenterologist can be reached at any hour. Please call [phone]  for any of the following symptoms:  Following lower endoscopy (colonoscopy, flexible sigmoidoscopy) Excessive amounts of blood in the stool  Significant tenderness, worsening of abdominal pains   Swelling of the abdomen that is new, acute  Fever of 100 or higher    FOLLOW UP:  If any biopsies were taken you will be contacted by phone or by letter within the next 1-3 weeks. Call [phone]  if you have not heard about the biopsies in 3 weeks.  Please also call with any specific questions about appointments or follow up tests.

## 2023-09-20 NOTE — Anesthesia Postprocedure Evaluation (Signed)
 Anesthesia Post Note  Patient: Paul Tyler  Procedure(s) Performed: COLONOSCOPY WITH PROPOFOL  ENDOSCOPIC MUCOSAL RESECTION SUBMUCOSAL LIFTING INJECTION HEMOSTASIS CLIP PLACEMENT POLYPECTOMY     Patient location during evaluation: PACU Anesthesia Type: MAC Level of consciousness: awake and alert Pain management: pain level controlled Vital Signs Assessment: post-procedure vital signs reviewed and stable Respiratory status: spontaneous breathing, nonlabored ventilation, respiratory function stable and patient connected to nasal cannula oxygen Cardiovascular status: stable and blood pressure returned to baseline Postop Assessment: no apparent nausea or vomiting Anesthetic complications: no   No notable events documented.  Last Vitals:  Vitals:   09/20/23 1034 09/20/23 1040  BP: (!) 168/35 (!) 158/42  Pulse: (!) 50 (!) 56  Resp: 12 16  Temp:    SpO2: 100% 100%    Last Pain:  Vitals:   09/20/23 1040  TempSrc:   PainSc: 0-No pain                 Paul Tyler

## 2023-09-20 NOTE — Anesthesia Preprocedure Evaluation (Addendum)
 Anesthesia Evaluation  Patient identified by MRN, date of birth, ID band Patient awake    Reviewed: Allergy & Precautions, H&P , NPO status , Patient's Chart, lab work & pertinent test results  Airway Mallampati: II  TM Distance: >3 FB Neck ROM: Full    Dental no notable dental hx. (+) Loose,    Pulmonary former smoker   Pulmonary exam normal breath sounds clear to auscultation       Cardiovascular hypertension, + CAD and + Peripheral Vascular Disease  Normal cardiovascular exam Rhythm:Regular Rate:Normal     Neuro/Psych   Anxiety     negative neurological ROS     GI/Hepatic Neg liver ROS,,,Adenomatous colon polyp   Endo/Other  negative endocrine ROS    Renal/GU negative Renal ROS   Prostate cancer    Musculoskeletal negative musculoskeletal ROS (+)    Abdominal   Peds negative pediatric ROS (+)  Hematology negative hematology ROS (+)   Anesthesia Other Findings   Reproductive/Obstetrics negative OB ROS                             Anesthesia Physical Anesthesia Plan  ASA: 3  Anesthesia Plan: MAC   Post-op Pain Management:    Induction: Intravenous  PONV Risk Score and Plan: 1 and Propofol  infusion and Treatment may vary due to age or medical condition  Airway Management Planned: Natural Airway  Additional Equipment:   Intra-op Plan:   Post-operative Plan:   Informed Consent: I have reviewed the patients History and Physical, chart, labs and discussed the procedure including the risks, benefits and alternatives for the proposed anesthesia with the patient or authorized representative who has indicated his/her understanding and acceptance.     Dental advisory given  Plan Discussed with: CRNA  Anesthesia Plan Comments:         Anesthesia Quick Evaluation

## 2023-09-21 ENCOUNTER — Telehealth: Payer: Self-pay | Admitting: *Deleted

## 2023-09-21 LAB — SURGICAL PATHOLOGY

## 2023-09-21 NOTE — Progress Notes (Signed)
  Radiation Oncology         [phone] ________________________________  Name: Paul Tyler MRN: [id number]  Date: 09/22/2023  DOB: 07-25-1951  SIMULATION AND TREATMENT PLANNING NOTE PUBIC ARCH STUDY  RR:Upfazmojxz, [name], MD  Watt Rush, MD  DIAGNOSIS: 73 y.o. gentleman with Stage T1c adenocarcinoma of the prostate with Gleason Score of 3+4, and PSA of 13.7.   Oncology History  Malignant neoplasm of prostate (HCC)  01/06/2018 Initial Diagnosis   Malignant neoplasm of prostate (HCC)   04/20/2023 Cancer Staging   Staging form: Prostate, AJCC 8th Edition - Clinical stage from 04/20/2023: Stage IIB (cT1c, cN0, cM0, PSA: 13.7, Grade Group: 2) - Signed by Sherwood Rise, PA-C on 05/31/2023 Histopathologic type: Adenocarcinoma, NOS Stage prefix: Initial diagnosis Prostate specific antigen (PSA) range: 10 to 19 Gleason primary pattern: 3 Gleason secondary pattern: 4 Gleason score: 7 Histologic grading system: 5 grade system Number of biopsy cores examined: 12 Number of biopsy cores positive: 3 Location of positive needle core biopsies: One side       ICD-10-CM   1. Malignant neoplasm of prostate (HCC)  C61       COMPLEX SIMULATION:  The patient presented today for evaluation for possible prostate seed implant. He was brought to the radiation planning suite and placed supine on the CT couch. A 3-dimensional image study set was obtained in upload to the planning computer. There, on each axial slice, I contoured the prostate gland. Then, using three-dimensional radiation planning tools I reconstructed the prostate in view of the structures from the transperineal needle pathway to assess for possible pubic arch interference. In doing so, I did not appreciate any pubic arch interference. Also, the patient's prostate volume was estimated based on the drawn structure. The volume was 42 cc.  Given the pubic arch appearance and prostate volume, patient remains a good candidate to proceed  with prostate seed implant. Today, he freely provided informed written consent to proceed.    PLAN: The patient will undergo prostate seed implant.   ________________________________  Donnice LABOR. Patrcia, M.D.

## 2023-09-21 NOTE — Progress Notes (Signed)
 Radiation Oncology         [phone] ________________________________  Outpatient Follow up- Pre-seed visit  Name: Paul Tyler MRN: [id number]  Date: 09/22/2023  DOB: [birth year]  RR:[id number], Paul RAMAN, MD  Watt Rush, MD   REFERRING PHYSICIAN: Watt Rush, MD  DIAGNOSIS: 73 y.o. gentleman with Stage T1c adenocarcinoma of the prostate with Gleason Score of 3+4, and PSA of 13.7.     ICD-10-CM   1. Malignant neoplasm of prostate (HCC)  C61       HISTORY OF PRESENT ILLNESS: Paul Tyler is a 73 y.o. male with a diagnosis of prostate cancer. We initially met the patient back in 12/2017. In summary, he was diagnosed with low volume Gleason 3+4 prostate cancer in 2/12 cores on prostate biopsy performed on 10/12/17 with a PSA of 5.7. After our discussion with the patient, he opted to proceed with active surveillance. His PSA has fluctuated over the years but remained overall stable through 05/2021. He underwent surveillance biopsies in 08/2018 and 01/2021, both of which were stable with low volume Gleason 3+4, similar to his initial biopsy. His PSA began to rise again, up to 9.21 in 11/2021 and 11.4 in 06/2022. This prompted a surveillance PSMA PET scan on 08/02/22 showing prostatic adenocarcinoma in the left lobe of the prostate but without evidence of metastatic adenopathy, visceral metastasis, or skeletal metastasis.    Since that time, his PSA has continued to rise, up to 12.1 in 10/2022 and most recently at 13.7 in 02/2023. He underwent surveillance biopsy on 04/20/23. This again showed Gleason 3+4 prostate cancer, now involving 3 out of 12 cores, all on the left side.  The patient reviewed the biopsy results with his urologist and was kindly referred to us  for discussion of potential radiation treatment options. We initially met the patient on 05/31/23 and he was most interested in proceeding with brachytherapy and SpaceOAR gel placement for treatment of his disease. He is here today for  his pre-procedure imaging for planning and to answer any additional questions he may have about this treatment.   PREVIOUS RADIATION THERAPY: No  PAST MEDICAL HISTORY:  Past Medical History:  Diagnosis Date   Adenomatous colon polyp    Anginal pain (HCC)    Anxiety    Coronary artery disease    Hyperlipidemia    Hypertension    Impotence of organic origin    Insomnia, unspecified    Prostate cancer (HCC)    Psoriasis    PVD (peripheral vascular disease) (HCC)    Recurrent low back pain    Sleep apnea    Diagnosed A very long time ago. Does not use CPAP.   SOB (shortness of breath) on exertion       PAST SURGICAL HISTORY: Past Surgical History:  Procedure Laterality Date   ABDOMINAL AORTOGRAM W/LOWER EXTREMITY N/A 06/09/2022   Procedure: ABDOMINAL AORTOGRAM W/LOWER EXTREMITY;  Surgeon: Darron Deatrice LABOR, MD;  Location: [facility];  Service: Cardiovascular;  Laterality: N/A;   CORONARY ANGIOPLASTY WITH STENT PLACEMENT  04/13/2012   MID LAD   LEFT HEART CATHETERIZATION WITH CORONARY ANGIOGRAM N/A 04/13/2012   Procedure: LEFT HEART CATHETERIZATION WITH CORONARY ANGIOGRAM;  Surgeon: Candyce Tyler Reek, MD;  Location: [facility] CATH LAB;  Service: Cardiovascular;  Laterality: N/A;   PROSTATE BIOPSY     TONSILLECTOMY      FAMILY HISTORY:  Family History  Problem Relation Age of Onset   Heart disease Mother        ?  Stroke Mother        On Coumadin   Diabetes Mother        Borderline, no meds   COPD Mother    Prostate cancer Father    Heart disease Father    Diabetes Father    Prostate cancer Maternal Uncle    Prostate cancer Maternal Uncle    Stomach cancer Maternal Grandmother    Heart attack Neg Hx    Hypertension Neg Hx    Colon cancer Neg Hx    Esophageal cancer Neg Hx    Inflammatory bowel disease Neg Hx    Liver disease Neg Hx    Pancreatic cancer Neg Hx    Rectal cancer Neg Hx     SOCIAL HISTORY:  Social History   Socioeconomic History    Marital status: Married    Spouse name: Particia   Number of children: 1   Years of education: Not on file   Highest education level: Not on file  Occupational History    Comment: Full time   Occupation: retired  Tobacco Use   Smoking status: Former    Current packs/day: 0.00    Average packs/day: 1.5 packs/day for 30.0 years (45.0 ttl pk-yrs)    Types: Cigarettes    Start date: 04/17/1981    Quit date: 04/18/2011    Years since quitting: 12.4   Smokeless tobacco: Never   Tobacco comments:    Patient is currently not smoking  Vaping Use   Vaping status: Every Day   Substances: Nicotine  Substance and Sexual Activity   Alcohol use: Yes    Alcohol/week: 3.0 - 4.0 standard drinks of alcohol    Types: 3 - 4 Cans of beer per week    Comment: 3-4/day twice a week   Drug use: No   Sexual activity: Yes  Other Topics Concern   Not on file  Social History Narrative   Resides in [place] with wife, [place]. Continues to work full time.    Social Drivers of Corporate Investment Banker Strain: Not on file  Food Insecurity: No Food Insecurity (05/31/2023)   Hunger Vital Sign    Worried About Running Out of Food in the Last Year: Never true    Ran Out of Food in the Last Year: Never true  Transportation Needs: No Transportation Needs (05/31/2023)   PRAPARE - Administrator, Civil Service (Medical): No    Lack of Transportation (Non-Medical): No  Physical Activity: Not on file  Stress: Not on file  Social Connections: Not on file  Intimate Partner Violence: Not At Risk (05/31/2023)   Humiliation, Afraid, Rape, and Kick questionnaire    Fear of Current or Ex-Partner: No    Emotionally Abused: No    Physically Abused: No    Sexually Abused: No    ALLERGIES: Lipitor [atorvastatin calcium ], Zyban [bupropion hcl], Zocor [simvastatin], and Pravachol [pravastatin]  MEDICATIONS:  Current Outpatient Medications  Medication Sig Dispense Refill   ALPRAZolam  (XANAX ) 0.25 MG tablet  Take 0.125-0.25 mg by mouth 3 (three) times daily as needed for anxiety.     chlorthalidone  (HYGROTON ) 25 MG tablet Take 25 mg by mouth every morning.     Cholecalciferol (VITAMIN D) 2000 UNITS CAPS Take 2,000 Units by mouth daily.     [Paused] clopidogrel  (PLAVIX ) 75 MG tablet Take 1 tablet by mouth once daily 30 tablet 0   losartan  (COZAAR ) 100 MG tablet Take 1 tablet (100 mg total) by mouth daily. 90  tablet 3   nitroGLYCERIN  (NITROSTAT ) 0.4 MG SL tablet PLACE ONE TABLET UNDER THE TONGUE EVERY 5 MINUTES AS NEEDED FOR CHEST PAIN. 25 tablet 3   rosuvastatin  (CRESTOR ) 10 MG tablet Take 10 mg by mouth daily.     sildenafil  (VIAGRA ) 100 MG tablet Take 100 mg by mouth as needed.     Witch Hazel (TUCKS MEDICATED COOLING EX) Place 1 Pad rectally as needed (irritation).     No current facility-administered medications for this visit.    REVIEW OF SYSTEMS:  On review of systems, the patient reports that he is doing well overall. He denies any chest pain, shortness of breath, cough, fevers, chills, night sweats, unintended weight changes. He denies any bowel disturbances, and denies abdominal pain, nausea or vomiting. He denies any new musculoskeletal or joint aches or pains. His IPSS was 12, indicating moderate urinary symptoms. His SHIM was 6, indicating he has severe erectile dysfunction. A complete review of systems is obtained and is otherwise negative.     PHYSICAL EXAM:  Wt Readings from Last 3 Encounters:  09/20/23 177 lb 3.2 oz (80.4 kg)  08/23/23 184 lb (83.5 kg)  08/03/23 182 lb 2 oz (82.6 kg)   Temp Readings from Last 3 Encounters:  09/20/23 97.8 F (36.6 C) (Temporal)  05/31/23 97.8 F (36.6 C) (Temporal)  09/10/22 98.2 F (36.8 C)   BP Readings from Last 3 Encounters:  09/20/23 (!) 158/42  08/23/23 (!) 112/58  08/03/23 (!) 140/70   Pulse Readings from Last 3 Encounters:  09/20/23 (!) 56  08/23/23 (!) 56  08/03/23 68    /10  In general this is a well appearing Caucasian  male in no acute distress. He's alert and oriented x4 and appropriate throughout the examination. Cardiopulmonary assessment is negative for acute distress, and he exhibits normal effort.     KPS = 100  100 - Normal; no complaints; no evidence of disease. 90   - Able to carry on normal activity; minor signs or symptoms of disease. 80   - Normal activity with effort; some signs or symptoms of disease. 63   - Cares for self; unable to carry on normal activity or to do active work. 60   - Requires occasional assistance, but is able to care for most of his personal needs. 50   - Requires considerable assistance and frequent medical care. 40   - Disabled; requires special care and assistance. 30   - Severely disabled; hospital admission is indicated although death not imminent. 20   - Very sick; hospital admission necessary; active supportive treatment necessary. 10   - Moribund; fatal processes progressing rapidly. 0     - Dead  Karnofsky DA, Abelmann WH, Craver LS and Burchenal [facility] [phone]) The use of the nitrogen mustards in the palliative treatment of carcinoma: with particular reference to bronchogenic carcinoma Cancer 1 634-56  LABORATORY DATA:  Lab Results  Component Value Date   WBC 10.9 (H) 08/03/2023   HGB 15.1 08/03/2023   HCT 43.8 08/03/2023   MCV 91.4 08/03/2023   PLT 246.0 08/03/2023   Lab Results  Component Value Date   NA 138 08/03/2023   K 4.1 08/03/2023   CL 98 08/03/2023   CO2 33 (H) 08/03/2023   No results found for: ALT, AST, GGT, ALKPHOS, BILITOT   RADIOGRAPHY: No results found.    IMPRESSION/PLAN: 1. 73 y.o. gentleman with Stage T1c adenocarcinoma of the prostate with Gleason Score of 3+4, and PSA of 13.7.  The patient has elected to proceed with seed implant for treatment of his disease. We reviewed the risks, benefits, short and long-term effects associated with brachytherapy and discussed the role of SpaceOAR in reducing the rectal toxicity  associated with radiotherapy.  He appears to have a good understanding of his disease and our treatment recommendations which are of curative intent.  He was encouraged to ask questions that were answered to his stated satisfaction. He has freely signed written consent to proceed today in the office and a copy of this document will be placed in his medical record. His procedure is tentatively scheduled for 10/11/23 in collaboration with Dr. Watt and we will see him back for his post-procedure visit approximately 3 weeks thereafter. We look forward to continuing to participate in his care. He knows that he is welcome to call with any questions or concerns at any time in the interim.  I personally spent 30 minutes in this encounter including chart review, reviewing radiological studies, meeting face-to-face with the patient, entering orders and completing documentation.    Sabra MICAEL Rusk, MMS, PA-C [facility] Radiation Oncology Physician Assistant Direct Dial: [phone]  Fax: [phone]

## 2023-09-21 NOTE — Telephone Encounter (Signed)
 CALLED PATIENT TO REMIND OF PRE-SEED APPTS. FOR 09-22-23, LVM FOR A RETURN CALL

## 2023-09-22 ENCOUNTER — Ambulatory Visit
Admission: RE | Admit: 2023-09-22 | Discharge: 2023-09-22 | Disposition: A | Discharge status: Home / Self Care | Payer: Medicare Other | Source: Ambulatory Visit | Attending: Urology | Admitting: Urology

## 2023-09-22 ENCOUNTER — Encounter: Payer: Self-pay | Admitting: Urology

## 2023-09-22 ENCOUNTER — Ambulatory Visit
Admission: RE | Admit: 2023-09-22 | Discharge: 2023-09-22 | Disposition: A | Discharge status: Home / Self Care | Payer: Medicare Other | Source: Ambulatory Visit | Attending: Radiation Oncology | Admitting: Radiation Oncology

## 2023-09-22 VITALS — Resp 18 | Ht 65.0 in | Wt 178.0 lb

## 2023-09-22 DIAGNOSIS — C61 Malignant neoplasm of prostate: Secondary | ICD-10-CM | POA: Diagnosis not present

## 2023-09-22 DIAGNOSIS — Z191 Hormone sensitive malignancy status: Secondary | ICD-10-CM | POA: Diagnosis not present

## 2023-09-22 NOTE — Progress Notes (Signed)
 Pre-seed nursing interview for a diagnosis of Stage T1c adenocarcinoma of the prostate with Gleason Score of 3+4, and PSA of 13.7.  Patient identity verified x2.   Patient reports doing well. No issues conveyed at this time.  Meaningful use complete.  Urinary Management medication(s)- N one Urology appointment date- 10/2023, with Dr. Watt at [facility]  Resp 18   Ht 5' 5 (1.651 m)   Wt 178 lb (80.7 kg)   BMI 29.62 kg/m   This concludes the interaction.  Rosaline Minerva, LPN

## 2023-09-23 ENCOUNTER — Encounter (HOSPITAL_COMMUNITY): Payer: Self-pay | Admitting: Gastroenterology

## 2023-09-24 ENCOUNTER — Encounter: Payer: Self-pay | Admitting: Gastroenterology

## 2023-10-04 DIAGNOSIS — C61 Malignant neoplasm of prostate: Secondary | ICD-10-CM | POA: Diagnosis not present

## 2023-10-10 ENCOUNTER — Telehealth: Payer: Self-pay | Admitting: *Deleted

## 2023-10-10 NOTE — Telephone Encounter (Signed)
CALLED PATIENT TO REMIND OF PROCEDURE FOR 10-11-23, SPOKE WITH PATIENT AND HE IS AWARE OF THIS PROCEDURE

## 2023-10-10 NOTE — Progress Notes (Signed)
Spoke with patient via telephone for preop interview, pt has cardiac clearance in Epic and instructions to hold Plavix for 5 days prior to procedure. Pt stated he had not held Plavix prior to procedure. Message left for [facility] surgery scheduler at [facility] letting her know patient had not held Plavix.

## 2023-10-11 ENCOUNTER — Ambulatory Visit (HOSPITAL_BASED_OUTPATIENT_CLINIC_OR_DEPARTMENT_OTHER): Admission: RE | Admit: 2023-10-11 | Payer: Medicare Other | Source: Home / Self Care | Admitting: Urology

## 2023-10-11 SURGERY — INSERTION, RADIATION SOURCE, PROSTATE
Anesthesia: General

## 2023-10-11 NOTE — Progress Notes (Signed)
Patient's brachytherapy was cancelled due to not holding anticoagulant.  Pending reschedule date.

## 2023-10-17 ENCOUNTER — Telehealth: Payer: Self-pay | Admitting: *Deleted

## 2023-10-17 ENCOUNTER — Other Ambulatory Visit: Payer: Self-pay | Admitting: Urology

## 2023-10-17 NOTE — Telephone Encounter (Signed)
CALLED PATIENT TO INFORM OF NEW IMPLANT DATE, SPOKE WITH PATIENT AND HE IS AWARE OF THE NEW DATE OF 12-06-23

## 2023-10-19 ENCOUNTER — Other Ambulatory Visit: Payer: Self-pay

## 2023-10-19 ENCOUNTER — Other Ambulatory Visit: Payer: Self-pay | Admitting: Cardiovascular Disease

## 2023-10-19 ENCOUNTER — Other Ambulatory Visit: Payer: Self-pay | Admitting: Interventional Cardiology

## 2023-10-19 MED ORDER — CLOPIDOGREL BISULFATE 75 MG PO TABS
75.0000 mg | ORAL_TABLET | Freq: Every day | ORAL | 3 refills | Status: AC
  Filled 2024-05-07 (×2): qty 30, 30d supply, fill #0
  Filled 2024-05-07: qty 90, 90d supply, fill #0
  Filled 2024-05-07: qty 30, 30d supply, fill #0
  Filled 2024-05-31: qty 30, 30d supply, fill #1
  Filled 2024-06-27 – 2024-07-03 (×2): qty 30, 30d supply, fill #2
  Filled 2024-07-30 (×2): qty 30, 30d supply, fill #3
  Filled 2024-08-23 – 2024-09-03 (×2): qty 30, 30d supply, fill #4
  Filled 2024-09-25 – 2024-10-02 (×2): qty 30, 30d supply, fill #5

## 2023-10-19 MED ORDER — CLOPIDOGREL BISULFATE 75 MG PO TABS: 75.0000 mg | ORAL_TABLET | Freq: Every day | ORAL | 0 refills | Status: DC

## 2023-10-19 NOTE — Addendum Note (Signed)
 Addended by: Debrah Fan, Moris Ratchford L on: 10/19/2023 08:53 AM   Modules accepted: Orders

## 2023-10-28 DIAGNOSIS — Z5181 Encounter for therapeutic drug level monitoring: Secondary | ICD-10-CM | POA: Diagnosis not present

## 2023-10-28 DIAGNOSIS — E782 Mixed hyperlipidemia: Secondary | ICD-10-CM | POA: Diagnosis not present

## 2023-10-28 DIAGNOSIS — I10 Essential (primary) hypertension: Secondary | ICD-10-CM | POA: Diagnosis not present

## 2023-10-28 DIAGNOSIS — R7309 Other abnormal glucose: Secondary | ICD-10-CM | POA: Diagnosis not present

## 2023-10-28 DIAGNOSIS — E559 Vitamin D deficiency, unspecified: Secondary | ICD-10-CM | POA: Diagnosis not present

## 2023-11-02 DIAGNOSIS — E782 Mixed hyperlipidemia: Secondary | ICD-10-CM | POA: Diagnosis not present

## 2023-11-02 DIAGNOSIS — I10 Essential (primary) hypertension: Secondary | ICD-10-CM | POA: Diagnosis not present

## 2023-11-02 DIAGNOSIS — E559 Vitamin D deficiency, unspecified: Secondary | ICD-10-CM | POA: Diagnosis not present

## 2023-11-02 DIAGNOSIS — F419 Anxiety disorder, unspecified: Secondary | ICD-10-CM | POA: Diagnosis not present

## 2023-11-02 DIAGNOSIS — Z23 Encounter for immunization: Secondary | ICD-10-CM | POA: Diagnosis not present

## 2023-11-02 DIAGNOSIS — Z0000 Encounter for general adult medical examination without abnormal findings: Secondary | ICD-10-CM | POA: Diagnosis not present

## 2023-11-21 NOTE — Progress Notes (Addendum)
 Anesthesia Review:  PCP: Garth Bigness  Cardiologist : Eldridge Dace LVO 07/13/23 haS not decided on new cardiologist since [facility] left per pt  [facility] 08/23/23 OV Clearance  PPM/ ICD: Device Orders: Rep Notified:  Chest x-ray : EKG : 08/23/23  Echo : Stress tes 07/13/22 t: 2021  Cardiac Cath :   Activity level: can do a flight of stairs without difficutly  Sleep Study/ CPAP : has sleep apnea no cpap  Fasting Blood Sugar :      / Checks Blood Sugar -- times a day:    Blood Thinner/ Instructions /Last Dose: ASA / Instructions/ Last Dose :    Plavix - stop 5 days prior per pt    PT at preop stated had Vomiting and Diarrhea on 11/12/2023 and 11/13/2023.  Did not see MD.  PT did not test for Covid or Flu.  Contacted from wife and grandchildren.  NO symptoms after 11/13/23 per pt.

## 2023-11-21 NOTE — Patient Instructions (Signed)
 SURGICAL WAITING ROOM VISITATION  Patients having surgery or a procedure may have no more than 2 support people in the waiting area - these visitors may rotate.    Children under the age of 33 must have an adult with them who is not the patient.  Due to an increase in RSV and influenza rates and associated hospitalizations, children ages 22 and under may not visit patients in [facility] hospitals.  Visitors with respiratory illnesses are discouraged from visiting and should remain at home.  If the patient needs to stay at the hospital during part of their recovery, the visitor guidelines for inpatient rooms apply. Pre-op nurse will coordinate an appropriate time for 1 support person to accompany patient in pre-op.  This support person may not rotate.    Please refer to the [facility] website for the visitor guidelines for Inpatients (after your surgery is over and you are in a regular room).       Your procedure is scheduled on:  12/06/23    Report to [facility] Main Entrance    Report to admitting at  0515 AM   Call this number if you have problems the morning of surgery [phone]   Do not eat food or drink liquids  :After Midnight.   Fleets enema nite before and am of surgery                    If you have questions, please contact your surgeon's office.   FOLLOW BOWEL PREP AND ANY ADDITIONAL PRE OP INSTRUCTIONS YOU RECEIVED FROM YOUR SURGEON'S OFFICE!!!     Oral Hygiene is also important to reduce your risk of infection.                                    Remember - BRUSH YOUR TEETH THE MORNING OF SURGERY WITH YOUR REGULAR TOOTHPASTE  DENTURES WILL BE REMOVED PRIOR TO SURGERY PLEASE DO NOT APPLY "Poly grip" OR ADHESIVES!!!   Do NOT smoke after Midnight   Stop all vitamins and herbal supplements 7 days before surgery.  None   Take these medicines the morning of surgery with A SIP OF WATER:   DO NOT TAKE ANY ORAL DIABETIC MEDICATIONS DAY OF YOUR  SURGERY  Bring CPAP mask and tubing day of surgery.                              You may not have any metal on your body including hair pins, jewelry, and body piercing             Do not wear make-up, lotions, powders, perfumes/cologne, or deodorant  Do not wear nail polish including gel and S&S, artificial/acrylic nails, or any other type of covering on natural nails including finger and toenails. If you have artificial nails, gel coating, etc. that needs to be removed by a nail salon please have this removed prior to surgery or surgery may need to be canceled/ delayed if the surgeon/ anesthesia feels like they are unable to be safely monitored.   Do not shave  48 hours prior to surgery.               Men may shave face and neck.   Do not bring valuables to the hospital. [facility] IS NOT  RESPONSIBLE   FOR VALUABLES.   Contacts, glasses, dentures or bridgework may not be worn into surgery.   Bring small overnight bag day of surgery.   DO NOT BRING YOUR HOME MEDICATIONS TO THE HOSPITAL. PHARMACY WILL DISPENSE MEDICATIONS LISTED ON YOUR MEDICATION LIST TO YOU DURING YOUR ADMISSION IN THE HOSPITAL!    Patients discharged on the day of surgery will not be allowed to drive home.  Someone NEEDS to stay with you for the first 24 hours after anesthesia.   Special Instructions: Bring a copy of your healthcare power of attorney and living will documents the day of surgery if you haven't scanned them before.              Please read over the following fact sheets you were given: IF YOU HAVE QUESTIONS ABOUT YOUR PRE-OP INSTRUCTIONS PLEASE CALL [phone]   If you received a COVID test during your pre-op visit  it is requested that you wear a mask when out in public, stay away from anyone that may not be feeling well and notify your surgeon if you develop symptoms. If you test positive for Covid or have been in contact with anyone that has tested positive in the last 10 days please  notify you surgeon.    [facility] - Preparing for Surgery Before surgery, you can play an important role.  Because skin is not sterile, your skin needs to be as free of germs as possible.  You can reduce the number of germs on your skin by washing with CHG (chlorahexidine gluconate) soap before surgery.  CHG is an antiseptic cleaner which kills germs and bonds with the skin to continue killing germs even after washing. Please DO NOT use if you have an allergy to CHG or antibacterial soaps.  If your skin becomes reddened/irritated stop using the CHG and inform your nurse when you arrive at Short Stay. Do not shave (including legs and underarms) for at least 48 hours prior to the first CHG shower.  You may shave your face/neck. Please follow these instructions carefully:  1.  Shower with CHG Soap the night before surgery and the  morning of Surgery.  2.  If you choose to wash your hair, wash your hair first as usual with your  normal  shampoo.  3.  After you shampoo, rinse your hair and body thoroughly to remove the  shampoo.                           4.  Use CHG as you would any other liquid soap.  You can apply chg directly  to the skin and wash                       Gently with a scrungie or clean washcloth.  5.  Apply the CHG Soap to your body ONLY FROM THE NECK DOWN.   Do not use on face/ open                           Wound or open sores. Avoid contact with eyes, ears mouth and genitals (private parts).                       Wash face,  Genitals (private parts) with your normal soap.             6.  Wash thoroughly,  paying special attention to the area where your surgery  will be performed.  7.  Thoroughly rinse your body with warm water from the neck down.  8.  DO NOT shower/wash with your normal soap after using and rinsing off  the CHG Soap.                9.  Pat yourself dry with a clean towel.            10.  Wear clean pajamas.            11.  Place clean sheets on your bed the night  of your first shower and do not  sleep with pets. Day of Surgery : Do not apply any lotions/deodorants the morning of surgery.  Please wear clean clothes to the hospital/surgery center.  FAILURE TO FOLLOW THESE INSTRUCTIONS MAY RESULT IN THE CANCELLATION OF YOUR SURGERY PATIENT SIGNATURE_________________________________  NURSE SIGNATURE__________________________________  ________________________________________________________________________

## 2023-11-23 ENCOUNTER — Encounter (HOSPITAL_COMMUNITY)
Admission: RE | Admit: 2023-11-23 | Discharge: 2023-11-23 | Disposition: A | Discharge status: Home / Self Care | Payer: Medicare Other | Source: Ambulatory Visit | Attending: Urology | Admitting: Urology

## 2023-11-23 ENCOUNTER — Encounter (HOSPITAL_COMMUNITY): Payer: Self-pay

## 2023-11-23 ENCOUNTER — Other Ambulatory Visit: Payer: Self-pay

## 2023-11-23 VITALS — BP 135/71 | HR 58 | Temp 98.4°F | Resp 16 | Ht 65.0 in | Wt 180.0 lb

## 2023-11-23 DIAGNOSIS — I4519 Other right bundle-branch block: Secondary | ICD-10-CM | POA: Insufficient documentation

## 2023-11-23 DIAGNOSIS — I10 Essential (primary) hypertension: Secondary | ICD-10-CM | POA: Insufficient documentation

## 2023-11-23 DIAGNOSIS — Z87891 Personal history of nicotine dependence: Secondary | ICD-10-CM | POA: Insufficient documentation

## 2023-11-23 DIAGNOSIS — C61 Malignant neoplasm of prostate: Secondary | ICD-10-CM | POA: Diagnosis not present

## 2023-11-23 DIAGNOSIS — G4733 Obstructive sleep apnea (adult) (pediatric): Secondary | ICD-10-CM | POA: Insufficient documentation

## 2023-11-23 DIAGNOSIS — Z955 Presence of coronary angioplasty implant and graft: Secondary | ICD-10-CM | POA: Insufficient documentation

## 2023-11-23 DIAGNOSIS — Z01812 Encounter for preprocedural laboratory examination: Secondary | ICD-10-CM | POA: Diagnosis not present

## 2023-11-23 DIAGNOSIS — I739 Peripheral vascular disease, unspecified: Secondary | ICD-10-CM | POA: Insufficient documentation

## 2023-11-23 DIAGNOSIS — Z7902 Long term (current) use of antithrombotics/antiplatelets: Secondary | ICD-10-CM | POA: Insufficient documentation

## 2023-11-23 DIAGNOSIS — Z79899 Other long term (current) drug therapy: Secondary | ICD-10-CM | POA: Insufficient documentation

## 2023-11-23 DIAGNOSIS — Z7982 Long term (current) use of aspirin: Secondary | ICD-10-CM | POA: Diagnosis not present

## 2023-11-23 DIAGNOSIS — Z01818 Encounter for other preprocedural examination: Secondary | ICD-10-CM

## 2023-11-23 DIAGNOSIS — I2510 Atherosclerotic heart disease of native coronary artery without angina pectoris: Secondary | ICD-10-CM | POA: Insufficient documentation

## 2023-11-23 LAB — CBC
HCT: 41.3 % (ref 39.0–52.0)
Hemoglobin: 13.5 g/dL (ref 13.0–17.0)
MCH: 31 pg (ref 26.0–34.0)
MCHC: 32.7 g/dL (ref 30.0–36.0)
MCV: 94.9 fL (ref 80.0–100.0)
Platelets: 251 10*3/uL (ref 150–400)
RBC: 4.35 MIL/uL (ref 4.22–5.81)
RDW: 12.3 % (ref 11.5–15.5)
WBC: 10.4 10*3/uL (ref 4.0–10.5)
nRBC: 0 % (ref 0.0–0.2)

## 2023-11-23 LAB — BASIC METABOLIC PANEL
Anion gap: 11 (ref 5–15)
BUN: 26 mg/dL — ABNORMAL HIGH (ref 8–23)
CO2: 27 mmol/L (ref 22–32)
Calcium: 9.5 mg/dL (ref 8.9–10.3)
Chloride: 105 mmol/L (ref 98–111)
Creatinine, Ser: 0.96 mg/dL (ref 0.61–1.24)
GFR, Estimated: >60 mL/min (ref >60)
Glucose, Bld: 110 mg/dL — ABNORMAL HIGH (ref 70–99)
Potassium: 4.4 mmol/L (ref 3.5–5.1)
Sodium: 143 mmol/L (ref 135–145)

## 2023-11-24 ENCOUNTER — Encounter (HOSPITAL_COMMUNITY): Payer: Self-pay

## 2023-11-24 NOTE — Anesthesia Preprocedure Evaluation (Addendum)
 Anesthesia Evaluation  Patient identified by MRN, date of birth, ID band Patient awake    Reviewed: Allergy & Precautions, H&P , NPO status , Patient's Chart, lab work & pertinent test results  Airway Mallampati: II  TM Distance: >3 FB Neck ROM: Limited    Dental  (+) Dental Advisory Given, Teeth Intact, Chipped,    Pulmonary sleep apnea , neg recent URI, former smoker   Pulmonary exam normal breath sounds clear to auscultation       Cardiovascular Exercise Tolerance: Good hypertension, Pt. on medications + angina  + CAD, + Cardiac Stents, + Peripheral Vascular Disease and + PND   Rhythm:Regular Rate:Normal  Echo 06/2022  1. Left ventricular ejection fraction, by estimation, is 70 to 75%. Left ventricular ejection fraction by 3D volume is 71 %. The left ventricle has hyperdynamic function. The left ventricle has no regional wall motion abnormalities. Left ventricular diastolic parameters are consistent with Grade I diastolic dysfunction (impaired relaxation).   2. Right ventricular systolic function is normal. The right ventricular size is normal. The estimated right ventricular systolic pressure is 7.8 mmHg.   3. The mitral valve is grossly normal. Trivial mitral valve regurgitation.   4. The aortic valve is tricuspid. Aortic valve regurgitation is not visualized. Aortic valve sclerosis is present, with no evidence of aortic valve stenosis.   5. The inferior vena cava is normal in size with greater than 50% respiratory variability, suggesting right atrial pressure of 3 mmHg.   Comparison(s): No prior Echocardiogram.     Neuro/Psych   Anxiety     negative neurological ROS     GI/Hepatic Adenomatous colon polyp   Endo/Other  negative endocrine ROS    Renal/GU negative Renal ROS   Prostate cancer    Musculoskeletal negative musculoskeletal ROS (+)    Abdominal  (+) + obese  Peds  Hematology negative hematology  ROS (+)   Anesthesia Other Findings   Reproductive/Obstetrics                             Anesthesia Physical Anesthesia Plan  ASA: 3  Anesthesia Plan: General   Post-op Pain Management: Tylenol PO (pre-op)*   Induction: Intravenous  PONV Risk Score and Plan: 1 and Treatment may vary due to age or medical condition, Ondansetron and Dexamethasone  Airway Management Planned: LMA and Oral ETT  Additional Equipment:   Intra-op Plan:   Post-operative Plan:   Informed Consent: I have reviewed the patients History and Physical, chart, labs and discussed the procedure including the risks, benefits and alternatives for the proposed anesthesia with the patient or authorized representative who has indicated his/her understanding and acceptance.     Dental advisory given  Plan Discussed with: CRNA  Anesthesia Plan Comments: (See PAT note from 3/12 by Sherlie Ban PA-C )        Anesthesia Quick Evaluation

## 2023-11-24 NOTE — Progress Notes (Signed)
 Case: [id number] Date/Time: 12/06/23 0715   Procedures:      INSERTION, RADIATION SOURCE, PROSTATE - 90 MINUTE CASE     INJECTION, HYDROGEL SPACER   Anesthesia type: General   Pre-op diagnosis: PROSTATE CANCER   Location: [facility]URE [facility] / [facility]   Surgeons: Bjorn Pippin, MD       DISCUSSION: Paul Tyler is a 73 year old male who presents to PAT prior to surgery above. PMH of former smoking, HTN, CAD s/p PCI (2013), severe PAD, OSA (no CPAP), chronic DOE, anxiety, prostate cancer.  Surgery was originally scheduled in Jan 2025 but rescheduled due to patient not holding Plavix.   Patient follows with Cardiology for hx of CAD s/p PCI in 2013. Presented with angina. Had low risk stress test in 2021 due to SOB. He also has PAD and is followed by Vascular. Seen on 08/23/23 for pre op clearance. And noted to be doing well. Cleared for surgery:  "Preoperative cardiovascular examination -Scheduled for colonoscopy on 09/20/2023 w/ Dr. Meridee Score and radioactive seed implant on 10/11/2023 with Dr. Annabell Howells -According to the Revised Cardiac Risk Index (RCRI), his Perioperative Risk of Major Cardiac Event is (%): 6.6. His Functional Capacity in METs is: 6.61 according to the [employer] Activity Status Index (DASI). Therefore, based on ACC/AHA guidelines, patient would be at acceptable risk for the planned procedure without further cardiovascular testing. I will route this recommendation to the requesting party via Epic fax function. "  Patient previously followed with Vascular for his PAD. Last seen in 2023 and imaging showed "occluded left iliac system, left common femoral artery, right common iliac artery with severe stenosis.  Distally, bilateral superficial femoral arteries occluded.  ABIs demonstrate no toe pressure, monophasic waveforms at the level of the ankle. On physical exam, he has nonpalpable pulses in the feet." Per Dr. Karin Lieu aortobifemoral bypass was recommended however patient has opted for  medical management. In last Cardiology note from Dec it was noted that patients symptoms have improved with weight loss and increasing exercise. He continues on ASA, Plavix, and a statin  LD Plavix: stop 5 days prior per pt   VS: BP 135/71   Pulse (!) 58   Temp 36.9 C (Oral)   Resp 16   Ht 5\' 5"  (1.651 m)   Wt 81.6 kg   SpO2 99%   BMI 29.95 kg/m   PROVIDERS: Shon Hale, MD Cardiologist:  Lorine Bears, MD    LABS: Labs reviewed: Acceptable for surgery. (all labs ordered are listed, but only abnormal results are displayed)  Labs Reviewed  BASIC METABOLIC PANEL - Abnormal; Notable for the following components:      Result Value   Glucose, Bld 110 (*)    BUN 26 (*)    All other components within normal limits  CBC     IMAGES:  CTA aorta/bifem 07/27/2022:  IMPRESSION: VASCULAR   1. Severe bilateral aortoiliac and superficial femoral artery disease worse on the left than the right as detailed above. 2. Small caliber distal aorta with extensive atherosclerotic vascular calcifications. No evidence of aneurysm or dissection. 3. Relatively spared bilateral popliteal and runoff arteries. 4. Coronary artery atherosclerotic vascular calcifications.   NON-VASCULAR   1. No acute abnormality within the abdomen or pelvis. 2. Hepatic steatosis. 3. Lower lumbar degenerative disc disease. 4. Small fat containing umbilical hernia. 5. Additional ancillary findings as above.  EKG 08/23/23:  Sinus bradycardia Incomplete RBBB  CV:  Echo 07/13/2022:  IMPRESSIONS    1. Left  ventricular ejection fraction, by estimation, is 70 to 75%. Left ventricular ejection fraction by 3D volume is 71 %. The left ventricle has hyperdynamic function. The left ventricle has no regional wall motion abnormalities. Left ventricular diastolic parameters are consistent with Grade I diastolic dysfunction (impaired relaxation).  2. Right ventricular systolic function is normal. The  right ventricular size is normal. The estimated right ventricular systolic pressure is 7.8 mmHg.  3. The mitral valve is grossly normal. Trivial mitral valve regurgitation.  4. The aortic valve is tricuspid. Aortic valve regurgitation is not visualized. Aortic valve sclerosis is present, with no evidence of aortic valve stenosis.  5. The inferior vena cava is normal in size with greater than 50% respiratory variability, suggesting right atrial pressure of 3 mmHg.  Stress test 08/28/2020: Nuclear stress EF: 72%. The left ventricular ejection fraction is hyperdynamic (>65%). There was no ST segment deviation noted during stress. This is a low risk study. There is no evidence of ischemia and no evidence of previous infarction. The study is normal.   Past Medical History:  Diagnosis Date   Adenomatous colon polyp    Anginal pain (HCC)    Anxiety    Coronary artery disease    Hyperlipidemia    Hypertension    Impotence of organic origin    Insomnia, unspecified    Prostate cancer (HCC)    Psoriasis    PVD (peripheral vascular disease) (HCC)    Recurrent low back pain    Sleep apnea    Diagnosed "A very long time ago". Does not use CPAP.   SOB (shortness of breath) on exertion     Past Surgical History:  Procedure Laterality Date   ABDOMINAL AORTOGRAM W/LOWER EXTREMITY N/A 06/09/2022   Procedure: ABDOMINAL AORTOGRAM W/LOWER EXTREMITY;  Surgeon: Iran Ouch, MD;  Location: [facility];  Service: Cardiovascular;  Laterality: N/A;   COLONOSCOPY WITH PROPOFOL N/A 09/20/2023   Procedure: COLONOSCOPY WITH PROPOFOL;  Surgeon: Meridee Score Netty Starring., MD;  Location: [facility];  Service: Gastroenterology;  Laterality: N/A;   CORONARY ANGIOPLASTY WITH STENT PLACEMENT  04/13/2012   MID LAD   ENDOSCOPIC MUCOSAL RESECTION N/A 09/20/2023   Procedure: ENDOSCOPIC MUCOSAL RESECTION;  Surgeon: Meridee Score Netty Starring., MD;  Location: [facility];  Service: Gastroenterology;   Laterality: N/A;   HEMOSTASIS CLIP PLACEMENT  09/20/2023   Procedure: HEMOSTASIS CLIP PLACEMENT;  Surgeon: Lemar Lofty., MD;  Location: [facility];  Service: Gastroenterology;;   LEFT HEART CATHETERIZATION WITH CORONARY ANGIOGRAM N/A 04/13/2012   Procedure: LEFT HEART CATHETERIZATION WITH CORONARY ANGIOGRAM;  Surgeon: Corky Crafts, MD;  Location: [facility];  Service: Cardiovascular;  Laterality: N/A;   POLYPECTOMY  09/20/2023   Procedure: POLYPECTOMY;  Surgeon: Lemar Lofty., MD;  Location: [facility];  Service: Gastroenterology;;   PROSTATE BIOPSY     SUBMUCOSAL LIFTING INJECTION  09/20/2023   Procedure: SUBMUCOSAL LIFTING INJECTION;  Surgeon: Lemar Lofty., MD;  Location: [facility];  Service: Gastroenterology;;   TONSILLECTOMY      MEDICATIONS:  ALPRAZolam (XANAX) 0.25 MG tablet   bismuth subsalicylate (PEPTO BISMOL) 262 MG chewable tablet   chlorthalidone (HYGROTON) 25 MG tablet   Cholecalciferol (VITAMIN D) 2000 UNITS CAPS   clopidogrel (PLAVIX) 75 MG tablet   cyanocobalamin (VITAMIN B12) 1000 MCG tablet   docusate sodium (COLACE) 100 MG capsule   losartan (COZAAR) 100 MG tablet   nitroGLYCERIN (NITROSTAT) 0.4 MG SL tablet   rosuvastatin (CRESTOR) 10 MG tablet   sildenafil (VIAGRA) 100  MG tablet   No current facility-administered medications for this encounter.   Marcille Blanco [facility] Short Stay/Anesthesiology [facility] Phone [phone] 11/24/2023 1:55 PM

## 2023-12-05 ENCOUNTER — Telehealth: Payer: Self-pay | Admitting: *Deleted

## 2023-12-05 NOTE — Telephone Encounter (Signed)
 CALLED PATIENT TO REMIND OF PROCEDURE FOR 12-06-23 SPOKE WITH PATIENT AND HE IS AWARE OF THIS PROCEDURE

## 2023-12-05 NOTE — H&P (Signed)
 Pt presents today for pre-operative history and physical exam in anticipation of brachytherapy and space oar placement by Dr. Annabell Howells on 10/11/23. He is doing well and is without complaint.   Cardiac clearance obtained and ok'd to stop Plavix 5 days pre-op.   Pt denies F/C, HA, CP, SOB, N/V, diarrhea/constipation, back pain, flank pain, hematuria, and dysuria.   HX:     CC: I have prostate cancer.  HPI: Paul Tyler is a 73 year-old male established patient who is here evaluation for treatment of prostate cancer.  05/18/23: [name] returns today in f/u. He had a surveillance biopsy on 04/20/23 for a rising PSA of 13.7 and a history of GG2 prostate cancer with a right apical nodule. He was found to have 3 cores with GG2 disease in 5% of the LML core, 20% of the LAL core and 80% of the LAM core. He has favorable intermediate risk stage T2a Nx Mx GG2 cancer with a 7% LNI probability on the [facility] nomogram. His prostate volume is 40ml. His IPSS is 13 with a reduced stream and some frequency. His SHIM is 5.   04/20/23: [name] returns today in f/u for a surveillance biopsy for the history below.   02/14/23: [name] returns today in f/u. his PSA is up further to 13.7 from 12.1 in February. He remains on surveillance for his GG2 prostate cancer. He has elected for forgo the Aorto-Bifem bypass and is working on his physical conditioning is greatly improved. He has lost about 35lb. He remains on plavix but is able to come off of it for a procedure. He is voiding with stable luts and an IPSS of 9 with nocturia x 0. He has ED and has been using combination meds without much benefit.   11/05/22: [name] returns today in f/u for his history below. His PSA continues to rise and is up to 12.1 from 11.4 4 months ago. A PET was done that only showed uptake in the prostate. His IPSS today is 9 with stable nocturia x 1. Since 09/10/22 [facility] has [facility] [employer] and is doing water aerobics and has lost 28 lbs. He has seen vascular surgery and  is working on the weight loss to see if he will be a better candidate for an aorto-bifemoral bypass graft but he is not sure he wants to pursue that but he is improving with the exercise.   07/12/22: [name] returns today in f/u for his history of low volume GG2 prostate cancer with significant cardiac comorbidities. He has elected surveillance despite a rising PSA and a Prolaris that recommends single modal therapy. His PSA is up further to 11.4 prior to this visit. He has stable LUTS with an IPSS of 9 and nocturia x 1. He has some urgency. He has no bone pain. He has lost a few pounds with diet. He has ED and doesn't have much of a response to oral agents. He has PAD and had an attempt at endovascular management of the iliac vessels but that was unsuccessful so he is going to have a bypass procedure.   11/25/21: [name] returns today in f/u for his history of low volume GG2 prostate cancer. His PSA is up to 9.21 from 8.54 on 06/03/21. A Prolaris test done on his last path predicts a 3.6% DSM with surveillance and a 2.1% probabilty of mets with active treatment. He fell into the single modality treatment group. He has ED and didn't respond to combination oral therapy. He is not interested in  pursuing further therapy invasive therapy. He has moderate LUTS with an IPSS 12.   07/31/21: [name] returns today in f/u. His surveillance biopsy showed two cores at the left apex with Gleason 7(3+4) disease. He was to have a prolaris test but despite the order, that doesn't appear to have been done. The biopsy is essentially stable since his intial biopsy. He has had a near doubling of his PSA over the last 2 years. He has stable moderate LUTS.    06/10/21: [name] returns today in f/u for his history of prostate cancer. His PSA up further to 8.54 from 7.67 at his last visit but it was 8.51 in 12/21. He had a repeat MRIP in 5/22 that showed no high risk lesions. There is atrophy of the right PZ. He has Gleason  7(3+4) disease that was minimally changed on his biopsy in 12/19. He has stable LUTS and his IPSS is 12 with nocturia x 3. He has ED and had tried combination therapy with tadalafil and sildenafil but didn't have much effect.   11/24/2020: He returns for 3 month f/u. PSA decreased to 7.43. IPSS stable at 11, urgency and post-void dribbling remain his biggest complaint. He had a negative stress test with cardiology after last visit. His dyspnea has improved. Previously using sildenafil 100mg  and only partially responding. No longer using the medication. Adding tadalafil at last visit was discussed but deferred until further cardiology screening could occur. He has a NTG rx but has never had to use the medication.   08/20/2020: [name] returns today in f/u. He has a history of Gleason 7 prostate cancer and is on surveillance with his last biopsy in 12/19. The repeat biopsy showed 70% Gleason 7(3+4) in the left lateral apex and 2 cores of Gleason 6. His initial biopsy showed 40% 7(3+4) in that core and one Gleason 6 core. He had an MRIP prior to the biopsy that showed no suspicious lesions. His PSA is up to 8.51 from 5.76 in 6/21 and the prior high was 6.42 in 3/21.. The PSA has been variable. from 4.8. It was 4.3 prior to the surveillance biopsy but it has been up to 5.7 prior to the initial biopsy in 1/19. He is voiding with stable LUTS. His IPSS is 11. He never did try tamsulosin. He has some urgency and UUI but most of the leakage is post void dribbling. He has some frequency and intermittency. He has had no weight loss. He has no bone pain. He has ED but he stopped responding to sildenafil up to 100mg . He was able to void better because the penile length improved and he had less trouble with post void dribbling.   His most recent PSA is 13.7.     AUA Symptom Score: Less than 50% of the time he has the sensation of not emptying his bladder completely when finished urinating. 50% of the time he has to  urinate again fewer than two hours after he has finished urinating. 50% of the time he has to start and stop again several times when he urinates. Less than 20% of the time he finds it difficult to postpone urination. 50% of the time he has a weak urinary stream. Less than 20% of the time he has to push or strain to begin urination. He never has to get up to urinate from the time he goes to bed until the time he gets up in the morning.   Calculated AUA Symptom Score: 13  ALLERGIES: Lipitor TABS - Nausea Pravachol - muscle and/or joint pain statins Wellbutrin TABS - Skin Rash Zocor Zyban TB12 - joint pain    MEDICATIONS: Crestor 10 mg tablet  Plavix 75 mg tablet  Chlorthalidone 25 mg tablet 1 tablet PO Daily  Levofloxacin 750 mg tablet 1 po 1 hour prior to the procedure  Losartan Potassium 100 mg tablet  Nitroglycerin 0.4 mg tablet, sublingual  Rosuvastatin Calcium 10 mg tablet  Tadalafil 5 mg tablet 1-2 po prn with 1/2 tab of sildenafil.  Vitamin D3  Xanax 0.25 mg tablet     GU PSH: Prostate Needle Biopsy - 04/20/2023, 06/24/2021, 2019, 2019       PSH Notes: peripheral artery disease   NON-GU PSH: Cardiac Stent Placement Surgical Pathology, Gross And Microscopic Examination For Prostate Needle - 04/20/2023, 06/24/2021, 2019, 2019 Visit Complexity (formerly GPC1X) - 05/18/2023, 02/14/2023, 11/05/2022     GU PMH: ED due to arterial insufficiency - 05/18/2023, He didn't respond to combination oral therapy. , - 02/14/2023, He is having some nocturnal erections with the weight loss and exercise. He was encouraged to try the PDE5's again. , - 11/05/2022, He didn't have an additional response to the combination oral therapy. I discussed weight loss and diet to aid his function and the prostate cancer. I also discussed shockwave therapy and he might be interested in that. , - 11/25/2021, He didn't respond to the oral meds and he is not interested in further therapy. , - 06/10/2021, He never got the  tadalafil so I will refill that and he can try 10mg  with 50mg  of sildenafil as needed. , - 2022, - 2022, He has progressive ED and we can readdress that at his f/u if he has been cleared from a cardiac standpoint. He could try combination tadalafil and sildenafil possibly. , - 2021, - 2019 Elevated PSA - 05/18/2023, - 02/14/2023, - 11/05/2022, - 07/12/2022, - 11/25/2021, - 07/31/2021, - 06/10/2021, - 2022, - 2021, He has an elevated PSA that has been rising for the last 4 years. I am going to get him set up for a prostate [place] and biopsy. I reviewed the risks of bleeding, infection and voiding difficulty. , - 2019 Prostate Cancer, His cancer volume is slowly increasing along with the PSA. I discussed options with him including continued surveillance, RALP, Seeds and XRT. He is most interested in a seed implant and I reviewed the risks of the seeds and SpaceOAR in detail. I will get him set up to see Dr. Kathrynn Running again for further discussion. - 05/18/2023, - 04/20/2023, He has a stable nodule on exam but his PSA continues to rise. I am going to get him set up for a surveillance biopsy and then will consider therapy based on the results. I have reviewed the risks of the biopsy including bleeding, infection and voiding diffiiculty. , - 02/14/2023, His PSA is up a bit more and the PSMA PET showed only prostate uptake. I discussed options for treatment but will have him return in 3 months. , - 11/05/2022, His PSA is rising and he now has a small right apical nodule. I would like to stage him with a PSMA PET to determine if he still a candidate for local therapy, but if that isn't approved, he has a CT planned to prep for a possible aortobifem bypass and that would provide useful info. I will otherwise have him return in 3 months for a PSA. I think it is time to think more strongly about local  therapy, but his vascular disease may take precedent. , - 07/12/2022, His PSA continues to rise gradually and the Prolaris suggested he would be  best managed by single modal therapy. I expressed my concerns about the rising PSA, but he does have some comorbitiies and he would like to continue surveillance for now. I will have him return 72mo with a PSA. , - 11/25/2021, He has no clear progression of his disease on biopsy but his PSA is rising. He has a history of CAD and is on plavix s/p stenting. I will reorder the Prolaris to try to give a clearer estimate of his risk of prostate cancer mortality on surveillance. He will return in 4 months with a PSA. , - 07/31/2021, - 06/24/2021, His PSA continues to rise and it has been almost 3 years since his last biopsy. I will get him set up for the biopsy and sent Levaquin for the prep. I reviewed the risks of bleeding, infection and voiding difficulty. He will need clearance from Dr. Eldridge Dace. , - 06/10/2021, His PSA is stable over the last 6 months and the MRIP showed no high risk lesions. I will have him return in 19mo with a PSA and will consider a repeat biopsy toward the end of the year. , - 2022, - 2022, His PSA is up but his prostate nodule is small and stable. He has gained weight has had had recent dyspnea that is being evaluated. I have strongly recommended that he work on weight loss and exercise if permitted and mentioned the DASH diet. I am going to have him return in 3 months with another PSA before considering further evaluation of the prostate cancer. , - 2021, His PSA is down some. he will return with a PSA in 6 months and will need to be considered for a repeat biopsy at that time. , - 2021, He has a stable nodule but his PSA is up some. He did have his Covid vaccine a couple of weeks before and may have had an ejacluation prior as well. I will have him return in 19months with a PSA and if it is still up, he will need a repeat MRI and biopsy. , - 2021, His PSA is down. I will have him return in 6 months with a PSA. , - 2020, His PSA is up slightly and he had some volume progression on biopsy. I  reviewed the options with him including continued surveillance, surgical therapy and radiation therapy. If he chooses therapy he is interested in a seed implant but he wants to stay on surveillance for the next 3 months. He will return with a PSA. , - 2020, - 2019, His PSA is creeping back up and he is due for a surveillance biopsy. I will get an MRIP and set him up for either a fusion biopsy or standard biopsy depending on the MRI. I reviewed the risks of bleeding, infection and voiding difficulty. , - 2019, His PSA is stable. He has a right mid prostate nodule that feels most consistent with a stone but I didn't note it on prior exams. He does have prostatic calculi. I will have him return in 3 months with a PSA for a repeat exam and if the PSA and exam are stable, I will get an MRIP at the end of the year and plan on a surveillance biopsy for early 2020. , - 2019 (Stable), He has elected to pursue active surveillance. His PSA is down. I will have him  return in 3 months with a PSA. If the PSA remains stable, he will need an MRIP and repeat biopsy 1 year from the initial biopsy. , - 2019, T1c Nx Mx Gleason 7(3+4) with a 43ml prostate and moderate LUTs and severe ED as well as underlying cardiovascular disease. I discussed the options as noted below. If he decides on a course of surveillance I will try to get an Oncotype Dx to better assess risk since his PSA has been rising with a big jump over the past year and he has intermediate risk disease. I am going to have him see Dr. Kathrynn Running for consideration of seeds or EXRT and if he is interested in surgery I will have him see Dr. Laverle Patter. I feel he would be a good candidate for seeds. I will await Dr. Broadus Sailor Hevia recommendation but will set him up for a 3 month f/u with a PSA should he decide on surveillance. , - 2019 Prostate nodule w/ LUTS - 05/18/2023, - 04/20/2023, - 02/14/2023 (Stable), - 07/12/2022, He has stable LUTS with nocturia x 3. , - 06/10/2021, He continues to  void with mild LUTS. , - 2022, - 2022, He has some worsening issues with post void dribbling that is probably attributed to his further weight gain. , - 2021, He has mild LUTs with an IPSS of 7., - 2019 Urinary Frequency - 05/18/2023, - 02/14/2023 Weak Urinary Stream - 05/18/2023, - 02/14/2023, - 2021, - 2019 BPH w/LUTS - 11/05/2022, He has moderate LUTS but is not bothered. , - 11/25/2021, - 2019 Nocturia - 11/05/2022, - 07/12/2022, - 11/25/2021, - 06/10/2021 Urinary Urgency - 2022, (Worsening), He has some increased urgency. I have recommended he avoid dietary irritants. 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 - 2021 Personal history of other endocrine, nutritional and metabolic disease, History of hypercholesterolemia - 2014 Anxiety Coronary Artery Disease Hypercholesterolemia Hypertension    FAMILY HISTORY: Adult Sleep Apnea - Father copd - Mother Diabetes - Father Heart Disease - Father Prostate Cancer - Father   SOCIAL HISTORY: Marital Status: Married Preferred Language: English; Race: White Current Smoking Status: Patient does not smoke anymore.   Tobacco Use Assessment Completed: Used Tobacco in last 30 days? Drinks 12 drinks per week.  Does not use drugs. Drinks 2 caffeinated drinks per day. Has not had a blood transfusion. 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     Notes: 1 daughter  ETOH beer or rum 2-3 times per week    REVIEW OF SYSTEMS:    GU Review Male:   Patient denies frequent urination, hard to postpone urination, burning/ pain with urination, get up at night to urinate, leakage of urine, stream starts and stops, trouble starting your stream, have to strain to urinate , erection problems, and penile  pain.  Gastrointestinal (Upper):   Patient denies nausea, vomiting, and indigestion/ heartburn.  Gastrointestinal (Lower):   Patient denies diarrhea and constipation.  Constitutional:   Patient denies fever, night sweats, weight loss, and fatigue.  Skin:   Patient denies skin rash/ lesion and itching.  Eyes:   Patient denies blurred vision and double vision.  Ears/ Nose/ Throat:   Patient denies sore throat and sinus problems.  Hematologic/Lymphatic:  Patient denies swollen glands and easy bruising.  Cardiovascular:   Patient denies leg swelling and chest pains.  Respiratory:   Patient denies cough and shortness of breath.  Endocrine:   Patient denies excessive thirst.  Musculoskeletal:   Patient denies back pain and joint pain.  Neurological:   Patient denies headaches and dizziness.  Psychologic:   Patient denies depression and anxiety.   VITAL SIGNS:      10/04/2023 12:58 PM  BP 136/81 mmHg  Pulse 59 /min  Temperature 98.6 F / 37 C   MULTI-SYSTEM PHYSICAL EXAMINATION:    Constitutional: Well-nourished. No physical deformities. Normally developed. Good grooming.  Neck: Neck symmetrical, not swollen. Normal tracheal position.  Respiratory: Normal breath sounds. No labored breathing, no use of accessory muscles.   Cardiovascular: Regular rate and rhythm. No murmur, no gallop.   Lymphatic: No enlargement of neck, axillae, groin.  Skin: No paleness, no jaundice, no cyanosis. No lesion, no ulcer, no rash.  Neurologic / Psychiatric: Oriented to time, oriented to place, oriented to person. No depression, no anxiety, no agitation.  Gastrointestinal: No mass, no tenderness, no rigidity, obese abdomen.   Eyes: Normal conjunctivae. Normal eyelids.  Ears, Nose, Mouth, and Throat: Left ear no scars, no lesions, no masses. Right ear no scars, no lesions, no masses. Nose no scars, no lesions, no masses. Normal hearing. Normal lips.  Musculoskeletal: Normal gait and station of head and neck.      Complexity of Data:  Records Review:   Previous Patient Records  Urine Test Review:   Urinalysis   10/04/23  Urinalysis  Urine Appearance Clear   Urine Color Yellow   Urine Glucose Neg mg/dL  Urine Bilirubin Neg mg/dL  Urine Ketones Neg mg/dL  Urine Specific Gravity 1.020   Urine Blood Neg ery/uL  Urine pH 6.5   Urine Protein Trace mg/dL  Urine Urobilinogen 0.2 mg/dL  Urine Nitrites Neg   Urine Leukocyte Esterase Neg leu/uL   PROCEDURES:          Urinalysis - 81003 Dipstick Dipstick Cont'd  Color: Yellow Bilirubin: Neg mg/dL  Appearance: Clear Ketones: Neg mg/dL  Specific Gravity: 1.610 Blood: Neg ery/uL  pH: 6.5 Protein: Trace mg/dL  Glucose: Neg mg/dL Urobilinogen: 0.2 mg/dL    Nitrites: Neg    Leukocyte Esterase: Neg leu/uL    ASSESSMENT:      ICD-10 Details  1 GU:   Prostate Cancer - C61    PLAN:           Schedule Return Visit/Planned Activity: Keep Scheduled Appointment - Schedule Surgery          Document Letter(s):  Created for Patient: Clinical Summary         Notes:   There are no changes in the patients history or physical exam since last evaluation by Dr. Annabell Howells. Pt is scheduled to undergo seed and space oar placement on 10/11/23.   All pt's questions were answered to the best of my ability.          Next Appointment:      Next Appointment: 10/11/2023 07:30 AM    Appointment Type: Surgery     Location: [facility] [phone]    Provider: Bjorn Pippin, M.D.    Reason for Visit: OP NE SEED IMPLANT SPACE OAR

## 2023-12-06 ENCOUNTER — Encounter (HOSPITAL_COMMUNITY)
Admission: RE | Disposition: A | Discharge status: Home / Self Care | Payer: Self-pay | Source: Home / Self Care | Attending: Urology

## 2023-12-06 ENCOUNTER — Ambulatory Visit (HOSPITAL_COMMUNITY)
Admission: RE | Admit: 2023-12-06 | Discharge: 2023-12-06 | Disposition: A | Discharge status: Home / Self Care | Payer: Medicare Other | Attending: Urology | Admitting: Urology

## 2023-12-06 ENCOUNTER — Ambulatory Visit (HOSPITAL_COMMUNITY)

## 2023-12-06 ENCOUNTER — Other Ambulatory Visit: Payer: Self-pay | Admitting: Urology

## 2023-12-06 ENCOUNTER — Encounter (HOSPITAL_COMMUNITY): Payer: Self-pay | Admitting: Urology

## 2023-12-06 ENCOUNTER — Ambulatory Visit (HOSPITAL_COMMUNITY): Payer: Self-pay | Admitting: Medical

## 2023-12-06 ENCOUNTER — Ambulatory Visit (HOSPITAL_BASED_OUTPATIENT_CLINIC_OR_DEPARTMENT_OTHER): Payer: Self-pay | Admitting: Anesthesiology

## 2023-12-06 ENCOUNTER — Other Ambulatory Visit: Payer: Self-pay

## 2023-12-06 DIAGNOSIS — I739 Peripheral vascular disease, unspecified: Secondary | ICD-10-CM | POA: Diagnosis not present

## 2023-12-06 DIAGNOSIS — Z01818 Encounter for other preprocedural examination: Secondary | ICD-10-CM

## 2023-12-06 DIAGNOSIS — I10 Essential (primary) hypertension: Secondary | ICD-10-CM | POA: Diagnosis not present

## 2023-12-06 DIAGNOSIS — I25119 Atherosclerotic heart disease of native coronary artery with unspecified angina pectoris: Secondary | ICD-10-CM | POA: Diagnosis not present

## 2023-12-06 DIAGNOSIS — F419 Anxiety disorder, unspecified: Secondary | ICD-10-CM | POA: Diagnosis not present

## 2023-12-06 DIAGNOSIS — I2510 Atherosclerotic heart disease of native coronary artery without angina pectoris: Secondary | ICD-10-CM | POA: Diagnosis not present

## 2023-12-06 DIAGNOSIS — Z7902 Long term (current) use of antithrombotics/antiplatelets: Secondary | ICD-10-CM | POA: Diagnosis not present

## 2023-12-06 DIAGNOSIS — Z955 Presence of coronary angioplasty implant and graft: Secondary | ICD-10-CM | POA: Diagnosis not present

## 2023-12-06 DIAGNOSIS — I358 Other nonrheumatic aortic valve disorders: Secondary | ICD-10-CM | POA: Diagnosis not present

## 2023-12-06 DIAGNOSIS — Z8249 Family history of ischemic heart disease and other diseases of the circulatory system: Secondary | ICD-10-CM | POA: Insufficient documentation

## 2023-12-06 DIAGNOSIS — C61 Malignant neoplasm of prostate: Secondary | ICD-10-CM | POA: Insufficient documentation

## 2023-12-06 DIAGNOSIS — G4733 Obstructive sleep apnea (adult) (pediatric): Secondary | ICD-10-CM | POA: Diagnosis not present

## 2023-12-06 DIAGNOSIS — Z191 Hormone sensitive malignancy status: Secondary | ICD-10-CM | POA: Diagnosis not present

## 2023-12-06 DIAGNOSIS — Z87891 Personal history of nicotine dependence: Secondary | ICD-10-CM | POA: Diagnosis not present

## 2023-12-06 SURGERY — INSERTION, RADIATION SOURCE, PROSTATE
Anesthesia: General

## 2023-12-06 MED ORDER — LACTATED RINGERS IV SOLN: INTRAVENOUS | Status: DC

## 2023-12-06 MED ORDER — DEXAMETHASONE SODIUM PHOSPHATE 10 MG/ML IJ SOLN
INTRAMUSCULAR | Status: DC | PRN
  Administered 2023-12-06: 10 mg via INTRAVENOUS

## 2023-12-06 MED ORDER — ROCURONIUM BROMIDE 10 MG/ML (PF) SYRINGE
PREFILLED_SYRINGE | INTRAVENOUS | Status: DC | PRN
  Administered 2023-12-06: 70 mg via INTRAVENOUS

## 2023-12-06 MED ORDER — EPHEDRINE SULFATE-NACL 50-0.9 MG/10ML-% IV SOSY
PREFILLED_SYRINGE | INTRAVENOUS | Status: DC | PRN
  Administered 2023-12-06: 10 mg via INTRAVENOUS

## 2023-12-06 MED ORDER — IOPAMIDOL (ISOVUE-300) INJECTION 61%
INTRAVENOUS | Status: DC | PRN
  Administered 2023-12-06: 5 mL

## 2023-12-06 MED ORDER — SUGAMMADEX SODIUM 200 MG/2ML IV SOLN
INTRAVENOUS | Status: AC
  Filled 2023-12-06: qty 2

## 2023-12-06 MED ORDER — MIDAZOLAM HCL 2 MG/2ML IJ SOLN
INTRAMUSCULAR | Status: AC
  Filled 2023-12-06: qty 2

## 2023-12-06 MED ORDER — LIDOCAINE HCL (PF) 2 % IJ SOLN
INTRAMUSCULAR | Status: AC
  Filled 2023-12-06: qty 5

## 2023-12-06 MED ORDER — LIDOCAINE HCL (PF) 2 % IJ SOLN
INTRAMUSCULAR | Status: DC | PRN
  Administered 2023-12-06: 100 mg via INTRADERMAL

## 2023-12-06 MED ORDER — HYDROCODONE-ACETAMINOPHEN 5-325 MG PO TABS: 1.0000 | ORAL_TABLET | Freq: Four times a day (QID) | ORAL | 0 refills | Status: DC | PRN

## 2023-12-06 MED ORDER — DEXAMETHASONE SODIUM PHOSPHATE 10 MG/ML IJ SOLN
INTRAMUSCULAR | Status: AC
  Filled 2023-12-06: qty 1

## 2023-12-06 MED ORDER — FENTANYL CITRATE (PF) 100 MCG/2ML IJ SOLN
INTRAMUSCULAR | Status: DC | PRN
  Administered 2023-12-06 (×2): 50 ug via INTRAVENOUS

## 2023-12-06 MED ORDER — CIPROFLOXACIN IN D5W 400 MG/200ML IV SOLN
400.0000 mg | INTRAVENOUS | Status: AC
  Administered 2023-12-06: 400 mg via INTRAVENOUS
  Filled 2023-12-06: qty 200

## 2023-12-06 MED ORDER — MIDAZOLAM HCL 5 MG/5ML IJ SOLN
INTRAMUSCULAR | Status: DC | PRN
  Administered 2023-12-06: 2 mg via INTRAVENOUS

## 2023-12-06 MED ORDER — ONDANSETRON HCL 4 MG/2ML IJ SOLN
INTRAMUSCULAR | Status: AC
  Filled 2023-12-06: qty 2

## 2023-12-06 MED ORDER — SODIUM CHLORIDE (PF) 0.9 % IJ SOLN
INTRAMUSCULAR | Status: AC
  Filled 2023-12-06: qty 10

## 2023-12-06 MED ORDER — FLEET ENEMA RE ENEM
1.0000 | ENEMA | Freq: Once | RECTAL | Status: DC
  Filled 2023-12-06: qty 1

## 2023-12-06 MED ORDER — ORAL CARE MOUTH RINSE: 15.0000 mL | Freq: Once | OROMUCOSAL | Status: DC

## 2023-12-06 MED ORDER — FENTANYL CITRATE PF 50 MCG/ML IJ SOSY: 25.0000 ug | PREFILLED_SYRINGE | INTRAMUSCULAR | Status: DC | PRN

## 2023-12-06 MED ORDER — FENTANYL CITRATE (PF) 100 MCG/2ML IJ SOLN
INTRAMUSCULAR | Status: AC
  Filled 2023-12-06: qty 2

## 2023-12-06 MED ORDER — CHLORHEXIDINE GLUCONATE 0.12 % MT SOLN: 15.0000 mL | Freq: Once | OROMUCOSAL | Status: DC

## 2023-12-06 MED ORDER — EPHEDRINE 5 MG/ML INJ
INTRAVENOUS | Status: AC
  Filled 2023-12-06: qty 5

## 2023-12-06 MED ORDER — SODIUM CHLORIDE 0.9% FLUSH
3.0000 mL | Freq: Two times a day (BID) | INTRAVENOUS | Status: DC
Start: 2023-12-06 — End: 2023-12-06

## 2023-12-06 MED ORDER — PROPOFOL 10 MG/ML IV BOLUS
INTRAVENOUS | Status: AC
  Filled 2023-12-06: qty 20

## 2023-12-06 MED ORDER — DROPERIDOL 2.5 MG/ML IJ SOLN: 0.6250 mg | Freq: Once | INTRAMUSCULAR | Status: DC | PRN

## 2023-12-06 MED ORDER — ONDANSETRON HCL 4 MG/2ML IJ SOLN
INTRAMUSCULAR | Status: DC | PRN
  Administered 2023-12-06: 4 mg via INTRAVENOUS

## 2023-12-06 MED ORDER — PROPOFOL 1000 MG/100ML IV EMUL
INTRAVENOUS | Status: AC
  Filled 2023-12-06: qty 100

## 2023-12-06 MED ORDER — ACETAMINOPHEN 500 MG PO TABS: 1000.0000 mg | ORAL_TABLET | Freq: Once | ORAL | Status: DC

## 2023-12-06 MED ORDER — SUGAMMADEX SODIUM 200 MG/2ML IV SOLN
INTRAVENOUS | Status: DC | PRN
  Administered 2023-12-06: 200 mg via INTRAVENOUS

## 2023-12-06 MED ORDER — PROPOFOL 10 MG/ML IV BOLUS
INTRAVENOUS | Status: DC | PRN
  Administered 2023-12-06: 140 mg via INTRAVENOUS

## 2023-12-06 MED ORDER — STERILE WATER FOR IRRIGATION IR SOLN
Status: DC | PRN
  Administered 2023-12-06 (×2): 3000 mL

## 2023-12-06 SURGICAL SUPPLY — 38 items
BAG COUNTER SPONGE SURGICOUNT (BAG) IMPLANT
BAG URINE DRAIN 2000ML AR STRL (UROLOGICAL SUPPLIES) ×1 IMPLANT
BLADE CLIPPER SURG (BLADE) ×1 IMPLANT
CATH FOLEY 2WAY SLVR 5CC 16FR (CATHETERS) ×1 IMPLANT
CATH ROBINSON RED A/P 20FR (CATHETERS) ×1 IMPLANT
COVER BACK TABLE 60X90IN (DRAPES) IMPLANT
COVER MAYO STAND XLG (MISCELLANEOUS) IMPLANT
COVER SURGICAL LIGHT HANDLE (MISCELLANEOUS) ×1 IMPLANT
DRAPE SURG IRRIG POUCH 19X23 (DRAPES) ×1 IMPLANT
DRAPE U-SHAPE 47X51 STRL (DRAPES) ×1 IMPLANT
DRSG TEGADERM 4X4.75 (GAUZE/BANDAGES/DRESSINGS) ×2 IMPLANT
DRSG TEGADERM 8X12 (GAUZE/BANDAGES/DRESSINGS) ×2 IMPLANT
GLOVE BIO SURGEON STRL SZ7.5 (GLOVE) ×1 IMPLANT
GLOVE BIOGEL PI IND STRL 8 (GLOVE) ×3 IMPLANT
GLOVE ECLIPSE 8.0 STRL XLNG CF (GLOVE) ×1 IMPLANT
GLOVE SURG SS PI 8.0 STRL IVOR (GLOVE) IMPLANT
GOWN STRL REUS W/ TWL XL LVL3 (GOWN DISPOSABLE) ×1 IMPLANT
GRID BRACH TEMP 18GA 2.8X3X.75 (MISCELLANEOUS) IMPLANT
HOLDER FOLEY CATH W/STRAP (MISCELLANEOUS) ×1 IMPLANT
IMPL SPACEOAR SYSTEM 10ML (Spacer) ×1 IMPLANT
IMPLANT SPACEOAR SYSTEM 10ML (Spacer) ×1 IMPLANT
KIT TURNOVER KIT A (KITS) IMPLANT
MARKER SKIN DUAL TIP RULER LAB (MISCELLANEOUS) ×2 IMPLANT
NDL BRACHY 18G 5PK (NEEDLE) ×1 IMPLANT
NDL BRACHY 18G SINGLE (NEEDLE) ×1 IMPLANT
NDL PK MORGANSTERN STABILIZ (NEEDLE) IMPLANT
NEEDLE BRACHY 18G 5PK (NEEDLE) ×1 IMPLANT
NEEDLE BRACHY 18G SINGLE (NEEDLE) ×1 IMPLANT
NEEDLE PK MORGANSTERN STABILIZ (NEEDLE) IMPLANT
PACK CYSTO (CUSTOM PROCEDURE TRAY) ×1 IMPLANT
PENCIL SMOKE EVACUATOR (MISCELLANEOUS) IMPLANT
SEED GOLD PRELOAD 1.2X3 (Urological Implant) IMPLANT
STRIP CLOSURE SKIN 1/2X4 (GAUZE/BANDAGES/DRESSINGS) ×1 IMPLANT
SURGILUBE 2OZ TUBE FLIPTOP (MISCELLANEOUS) ×1 IMPLANT
SYR 10ML LL (SYRINGE) ×1 IMPLANT
Seeds radioactive IMPLANT
TOWEL OR 17X26 10 PK STRL BLUE (TOWEL DISPOSABLE) ×1 IMPLANT
UNDERPAD 30X36 HEAVY ABSORB (UNDERPADS AND DIAPERS) ×2 IMPLANT

## 2023-12-06 NOTE — Progress Notes (Signed)
 Radiation Oncology         [phone] ________________________________  Name: Paul Tyler MRN: [id number]  Date: 12/06/2023  DOB: [birth year]       Prostate Seed Implant  WU:[id number], Meridee Score, MD  No ref. provider found  DIAGNOSIS:  73 y.o. gentleman with Stage T1c adenocarcinoma of the prostate with Gleason Score of 3+4, and PSA of 13.7.   Oncology History  Malignant neoplasm of prostate (HCC)  01/06/2018 Initial Diagnosis   Malignant neoplasm of prostate (HCC)   04/20/2023 Cancer Staging   Staging form: Prostate, AJCC 8th Edition - Clinical stage from 04/20/2023: Stage IIB (cT1c, cN0, cM0, PSA: 13.7, Grade Group: 2) - Signed by Marcello Fennel, PA-C on 05/31/2023 Histopathologic type: Adenocarcinoma, NOS Stage prefix: Initial diagnosis Prostate specific antigen (PSA) range: 10 to 19 Gleason primary pattern: 3 Gleason secondary pattern: 4 Gleason score: 7 Histologic grading system: 5 grade system Number of biopsy cores examined: 12 Number of biopsy cores positive: 3 Location of positive needle core biopsies: One side       ICD-10-CM   1. Preop testing  Z01.818 CBG per Guidelines for Diabetes Management for Patients Undergoing Surgery ([facility], [facility], and [facility] only)    CBG per protocol    CBG per Guidelines for Diabetes Management for Patients Undergoing Surgery ([facility], [facility], and [facility] only)    CBG per protocol      PROCEDURE: Insertion of radioactive I-125 seeds into the prostate gland.  RADIATION DOSE: 145 Gy, definitive therapy.  TECHNIQUE: LIO WEHRLY was brought to the operating room with the urologist. He was placed in the dorsolithotomy position. He was catheterized and a rectal tube was inserted. The perineum was shaved, prepped and draped. The ultrasound probe was then introduced by me into the rectum to see the prostate gland.  TREATMENT DEVICE: I attached the needle grid to the ultrasound probe stand and anchor needles were placed.  3D PLANNING: The prostate was  imaged in 3D using a sagittal sweep of the prostate probe. These images were transferred to the planning computer. There, the prostate, urethra and rectum were defined on each axial reconstructed image. Then, the software created an optimized 3D plan and a few seed positions were adjusted. The quality of the plan was reviewed using [facility] information for the target and the following two organs at risk:  Urethra and Rectum.  Then the accepted plan was printed and handed off to the radiation therapist.  Under my supervision, the custom loading of the seeds and spacers was carried out using the quick loader.  These pre-loaded needles were then placed into the needle holder.Marland Kitchen  PROSTATE VOLUME STUDY:  Using transrectal ultrasound the volume of the prostate was verified to be 42.3 cc.  SPECIAL TREATMENT PROCEDURE/SUPERVISION AND HANDLING: The pre-loaded needles were then delivered by the urologist under sagittal guidance. A total of 16 needles were used to deposit 62 seeds in the prostate gland. The individual seed activity was 0.480 mCi.  SpaceOAR:  Yes  COMPLEX SIMULATION: At the end of the procedure, an anterior radiograph of the pelvis was obtained to document seed positioning and count. Cystoscopy was performed by the urologist to check the urethra and bladder.  MICRODOSIMETRY: At the end of the procedure, the patient was emitting 0.07 mR/hr at 1 meter. Accordingly, he was considered safe for hospital discharge.  PLAN: The patient will return to the radiation oncology clinic for post implant CT dosimetry in three weeks.   ________________________________  Artist Pais Kathrynn Running,  M.D.

## 2023-12-06 NOTE — Anesthesia Procedure Notes (Signed)
 Procedure Name: Intubation Date/Time: 12/06/2023 9:00 AM  Performed by: Doran Clay, CRNAPre-anesthesia Checklist: Patient identified, Emergency Drugs available, Suction available, Patient being monitored and Timeout performed Patient Re-evaluated:Patient Re-evaluated prior to induction Oxygen Delivery Method: Circle system utilized Preoxygenation: Pre-oxygenation with 100% oxygen Induction Type: IV induction Ventilation: Mask ventilation without difficulty Laryngoscope Size: Mac Grade View: Grade II Tube type: Oral Tube size: 7.5 mm Number of attempts: 1 Airway Equipment and Method: Stylet Placement Confirmation: ETT inserted through vocal cords under direct vision, positive ETCO2 and breath sounds checked- equal and bilateral Secured at: 23 cm Tube secured with: Tape Dental Injury: Teeth and Oropharynx as per pre-operative assessment

## 2023-12-06 NOTE — Interval H&P Note (Signed)
 History and Physical Interval Note:  12/06/2023 8:20 AM  Paul Tyler  has presented today for surgery, with the diagnosis of PROSTATE CANCER.  The various methods of treatment have been discussed with the patient and family. After consideration of risks, benefits and other options for treatment, the patient has consented to  Procedure(s) with comments: INSERTION, RADIATION SOURCE, PROSTATE (N/A) - 90 MINUTE CASE INJECTION, HYDROGEL SPACER (N/A) as a surgical intervention.  The patient's history has been reviewed, patient examined, no change in status, stable for surgery.  I have reviewed the patient's chart and labs.  Questions were answered to the patient's satisfaction.     Bjorn Pippin

## 2023-12-06 NOTE — Transfer of Care (Signed)
 Immediate Anesthesia Transfer of Care Note  Patient: Paul Tyler  Procedure(s) Performed: INSERTION, RADIATION SOURCE, PROSTATE INJECTION, HYDROGEL SPACER  Patient Location: PACU  Anesthesia Type:General  Level of Consciousness: sedated  Airway & Oxygen Therapy: Patient Spontanous Breathing and Patient connected to face mask oxygen  Post-op Assessment: Report given to RN and Post -op Vital signs reviewed and stable  Post vital signs: Reviewed and stable  Last Vitals:  Vitals Value Taken Time  BP    Temp    Pulse 70 12/06/23 1020  Resp 21 12/06/23 1020  SpO2 100 % 12/06/23 1020  Vitals shown include unfiled device data.  Last Pain:  Vitals:   12/06/23 0656  TempSrc:   PainSc: 0-No pain      Patients Stated Pain Goal: 4 (12/06/23 0656)  Complications: No notable events documented.

## 2023-12-06 NOTE — Discharge Instructions (Addendum)
 You may resume plavix tomorrow if there is no bleeding.

## 2023-12-06 NOTE — Anesthesia Postprocedure Evaluation (Signed)
 Anesthesia Post Note  Patient: Paul Tyler  Procedure(s) Performed: INSERTION, RADIATION SOURCE, PROSTATE INJECTION, HYDROGEL SPACER     Patient location during evaluation: PACU Anesthesia Type: General Level of consciousness: sedated and patient cooperative Pain management: pain level controlled Vital Signs Assessment: post-procedure vital signs reviewed and stable Respiratory status: spontaneous breathing Cardiovascular status: stable Anesthetic complications: no   No notable events documented.  Last Vitals:  Vitals:   12/06/23 1100 12/06/23 1130  BP: (!) 168/70 (!) 160/73  Pulse: (!) 50 (!) 53  Resp: 12   Temp:  36.8 C  SpO2: 96% 100%    Last Pain:  Vitals:   12/06/23 1130  TempSrc:   PainSc: 0-No pain                 Lewie Loron

## 2023-12-06 NOTE — Op Note (Signed)
 PATIENT:  [name]  PRE-OPERATIVE DIAGNOSIS:  Adenocarcinoma of the prostate  POST-OPERATIVE DIAGNOSIS:  Same  PROCEDURE:  Procedure(s): 1. I-125 radioactive seed implantation 2. SpaceOAR implantation. 3.  Cystoscopy  SURGEON:  Surgeon(s): Bjorn Pippin MD  Radiation oncologist: Dr. Margaretmary Dys  ANESTHESIA:  General  EBL:  Minimal  DRAINS: 16 French Foley catheter  INDICATION: Paul Tyler is a 73 y.o. with Stage T2a, Gleason 7(3+4) prostate cancer who has elected brachytherapy for treatment.  Description of procedure: After informed consent the patient was brought to the major OR, placed on the table and administered general anesthesia. He was then moved to the modified lithotomy position with his perineum perpendicular to the floor. His perineum and genitalia were then sterilely prepped. An official timeout was then performed. A 16 French Foley catheter was then placed in the bladder and filled with dilute contrast, a rectal tube was placed in the rectum and the transrectal ultrasound probe was placed in the rectum and affixed to the stand. He was then sterilely draped.  The sterile grid was installed.   Anchor needles were then placed.   Real time ultrasonography was used along with the seed planning software spot-pro version 3.1-00. This was used to develop the seed plan including the number of needles as well as number of seeds required for complete and adequate coverage. Real-time ultrasonography was then used along with the previously developed plan  to implant a total of 62 seeds using 15 needles for a target dose of 145 Gy. This proceeded without difficulty or complication.  The anchor needles and guide were removed and the SpaceOAR needle was passed under US guidance into the fat stripe posterior to the prostate with the tip in the midline at mid prostate. A puff of NS confirmed appropriate positioning and the SpaceOAR polymer was then injected over 10 seconds into  the space with excellent distribution.     A Foley catheter was then removed as well as the transrectal ultrasound probe and rectal probe. Flexible cystoscopy was then performed using the 17 French flexible scope which revealed a normal urethra throughout its length down to the sphincter which appeared intact. 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 The bladder was then entered and fully and systematically inspected.  The ureteral orifices were noted to be of normal configuration and position. The mucosa revealed no evidence of tumors. There were also no stones identified within the bladder.  no seeds or spacers were seen and/or removed from the bladder.  The cystoscope was then removed.  The drapes were removed.  The perineum was cleaned and dressed.  He was taken out of the lithotomy position and was awakened and taken to recovery room in stable and satisfactory condition. He tolerated procedure well and there were no intraoperative complications.

## 2023-12-07 ENCOUNTER — Encounter (HOSPITAL_COMMUNITY): Payer: Self-pay | Admitting: Urology

## 2023-12-09 NOTE — Progress Notes (Signed)
 Patient was a RadOnc Consult on 05/31/23 for his stage T1c adenocarcinoma of the prostate with Gleason Score of 3+4, and PSA of 13.7.  Patient proceed with treatment recommendations of brachytherapy and had his treatment on 12/06/23.   Patient is scheduled for a post seed CT Simulation and urology follow up's on 4/16.

## 2023-12-19 DIAGNOSIS — R8271 Bacteriuria: Secondary | ICD-10-CM | POA: Diagnosis not present

## 2023-12-19 DIAGNOSIS — R350 Frequency of micturition: Secondary | ICD-10-CM | POA: Diagnosis not present

## 2023-12-26 ENCOUNTER — Telehealth: Payer: Self-pay | Admitting: *Deleted

## 2023-12-26 NOTE — Telephone Encounter (Signed)
 CALLED PATIENT TO REMIND OF POST SEED APPTS. FOR 12-28-23- ARRIVAL TIME- 9:45 AM @ [facility], PATIENT TO HAVE MRI ON 12-28-23- ARRIVAL TIME- 11:45 AM @ [facility], PATIENT TO BE NPO- 4 HRS. PRIOR TO SCAN, LVM FOR A RETURN CALL

## 2023-12-27 NOTE — Progress Notes (Signed)
 Post-seed nursing interview for a diagnosis of Stage T1c adenocarcinoma of the prostate with Gleason Score of 3+4, and PSA of 13.7.  Patient identity verified x2.   Patient reports severe penile, groin, and rectal pain 7/10, w/ moderate urinary challenges.   All related body systems reviewed w/ patient.  Patient denies all other related issues at this time.  Meaningful use complete.  I-PSS (AUA) score- 16 - Moderate SHIM (ED) score- 5 Urinary Management medication(s) None Urology appointment date- 12/28/2023 9am with Dr. Inga Manges at [facility]  Vitals- BP (!) 149/68 (BP Location: Right Arm, Patient Position: Sitting, Cuff Size: Normal)   Pulse 60   Temp 97.6 F (36.4 C) (Temporal)   Resp 18   Ht 5\' 5"  (1.651 m)   Wt 185 lb 3.2 oz (84 kg)   SpO2 99%   BMI 30.82 kg/m   This concludes the interaction.  Paul Bodo, LPN

## 2023-12-28 ENCOUNTER — Ambulatory Visit (HOSPITAL_COMMUNITY)
Admission: RE | Admit: 2023-12-28 | Discharge: 2023-12-28 | Disposition: A | Discharge status: Home / Self Care | Source: Ambulatory Visit | Attending: Urology | Admitting: Urology

## 2023-12-28 ENCOUNTER — Ambulatory Visit
Admission: RE | Admit: 2023-12-28 | Discharge: 2023-12-28 | Disposition: A | Discharge status: Home / Self Care | Payer: Self-pay | Source: Ambulatory Visit | Attending: Urology | Admitting: Urology

## 2023-12-28 ENCOUNTER — Ambulatory Visit
Admission: RE | Admit: 2023-12-28 | Discharge: 2023-12-28 | Disposition: A | Discharge status: Home / Self Care | Payer: Self-pay | Source: Ambulatory Visit | Attending: Radiation Oncology | Admitting: Radiation Oncology

## 2023-12-28 ENCOUNTER — Encounter: Payer: Self-pay | Admitting: Urology

## 2023-12-28 VITALS — BP 149/68 | HR 60 | Temp 97.6°F | Resp 18 | Ht 65.0 in | Wt 185.2 lb

## 2023-12-28 DIAGNOSIS — C61 Malignant neoplasm of prostate: Secondary | ICD-10-CM | POA: Diagnosis not present

## 2023-12-28 NOTE — Progress Notes (Signed)
 Radiation Oncology         [phone] ________________________________  Name: Paul Tyler MRN: [id number]  Date: 12/28/2023  DOB: 1951/07/11  Post-Seed Follow-Up Visit Note  CC: Shon Hale, MD  Bjorn Pippin, MD  Diagnosis:   73 y.o. gentleman with Stage T1c adenocarcinoma of the prostate with Gleason Score of 3+4, and PSA of 13.7.     ICD-10-CM   1. Malignant neoplasm of prostate (HCC)  C61       Interval Since Last Radiation:  3 weeks 12/06/23:  Insertion of radioactive I-125 seeds into the prostate gland; 145 Gy, definitive therapy with placement of SpaceOAR gel.  Narrative:  The patient returns today for routine follow-up.  He is complaining of increased urinary frequency, urgency, dysuria at the start of the stream and urinary hesitation symptoms. He filled out a questionnaire regarding urinary function today providing and overall IPSS score of 16 characterizing his symptoms as moderate and gradually improving.  He specifically denies gross hematuria, straining to void or incontinence.  His pre-implant score was 12. He denies any abdominal pain or bowel symptoms.  He reports a healthy appetite and good energy level.  Overall, he is pleased with his progress to date.  ALLERGIES:  is allergic to lipitor [atorvastatin calcium], zyban [bupropion hcl], zocor [simvastatin], and pravachol [pravastatin].  Meds: Current Outpatient Medications  Medication Sig Dispense Refill   ALPRAZolam (XANAX) 0.25 MG tablet Take 0.125-0.25 mg by mouth 3 (three) times daily as needed for anxiety.     bismuth subsalicylate (PEPTO BISMOL) 262 MG chewable tablet Chew 524 mg by mouth as needed for indigestion or diarrhea or loose stools.     chlorthalidone (HYGROTON) 25 MG tablet Take 25 mg by mouth every morning.     Cholecalciferol (VITAMIN D) 2000 UNITS CAPS Take 2,000 Units by mouth daily.     clopidogrel (PLAVIX) 75 MG tablet Take 1 tablet (75 mg total) by mouth daily. 90 tablet 3    cyanocobalamin (VITAMIN B12) 1000 MCG tablet Take 1,000 mcg by mouth daily.     docusate sodium (COLACE) 100 MG capsule Take 100 mg by mouth daily as needed for mild constipation.     HYDROcodone-acetaminophen (NORCO/VICODIN) 5-325 MG tablet Take 1 tablet by mouth every 6 (six) hours as needed for moderate pain (pain score 4-6). 6 tablet 0   losartan (COZAAR) 100 MG tablet Take 1 tablet (100 mg total) by mouth daily. 90 tablet 3   nitroGLYCERIN (NITROSTAT) 0.4 MG SL tablet PLACE ONE TABLET UNDER THE TONGUE EVERY 5 MINUTES AS NEEDED FOR CHEST PAIN. 25 tablet 3   rosuvastatin (CRESTOR) 10 MG tablet Take 10 mg by mouth daily.     sildenafil (VIAGRA) 100 MG tablet Take 100 mg by mouth as needed.     No current facility-administered medications for this encounter.    Physical Findings: In general this is a well appearing Caucasian male in no acute distress. He's alert and oriented x4 and appropriate throughout the examination. Cardiopulmonary assessment is negative for acute distress and he exhibits normal effort.   Lab Findings: Lab Results  Component Value Date   WBC 10.4 11/23/2023   HGB 13.5 11/23/2023   HCT 41.3 11/23/2023   MCV 94.9 11/23/2023   PLT 251 11/23/2023    Radiographic Findings:  Patient underwent CT imaging in our clinic for post implant dosimetry. The CT will be fused with his prostate MRI that will be performed today and will be reviewed by Dr. Kathrynn Running to  confirm there is an adequate distribution of radioactive seeds throughout the prostate gland and ensure that there are no seeds in or near the rectum. We suspect the final radiation plan and dosimetry will show appropriate coverage of the prostate gland. He understands that we will call and inform him of any unexpected findings on further review of his imaging and dosimetry.  Impression/Plan: The patient is recovering from the effects of radiation. His urinary symptoms should gradually improve over the next 4-6 months. We  talked about this today. He is encouraged by his improvement already and is otherwise pleased with his outcome. We also talked about long-term follow-up for prostate cancer following seed implant. He understands that ongoing PSA determinations and digital rectal exams will help perform surveillance to rule out disease recurrence. He had a follow up appointment with Dr. Inga Manges this morning and will see him again in 3 months for his initial post-treatment PSA. He understands what to expect with his PSA measures. Patient was also educated today about some of the long-term effects from radiation including a small risk for rectal bleeding and possibly erectile dysfunction. We talked about some of the general management approaches to these potential complications. However, I did encourage the patient to contact our office or return at any point if he has questions or concerns related to his previous radiation and prostate cancer.    Arta Bihari, PA-C

## 2023-12-28 NOTE — Progress Notes (Signed)
  Radiation Oncology         [phone] ________________________________  Name: Paul Tyler MRN: [id number]  Date: 12/28/2023  DOB: 05/18/1951  COMPLEX SIMULATION NOTE  NARRATIVE:  The patient was brought to the CT Simulation planning suite today following prostate seed implantation approximately one month ago.  Identity was confirmed.  All relevant records and images related to the planned course of therapy were reviewed.  Then, the patient was set-up supine.  CT images were obtained.  The CT images were loaded into the planning software.  Then the prostate and rectum were contoured.  Treatment planning then occurred.  The implanted iodine 125 seeds were identified by the physics staff for projection of radiation distribution  I have requested : 3D Simulation  I have requested a DVH of the following structures: Prostate and rectum.    ________________________________  Trilby Fujisawa Lorri Rota, M.D.

## 2023-12-30 ENCOUNTER — Ambulatory Visit
Admission: RE | Admit: 2023-12-30 | Discharge: 2023-12-30 | Disposition: A | Discharge status: Home / Self Care | Source: Ambulatory Visit | Attending: Radiation Oncology | Admitting: Radiation Oncology

## 2023-12-30 ENCOUNTER — Encounter: Payer: Self-pay | Admitting: Radiation Oncology

## 2023-12-30 DIAGNOSIS — Z191 Hormone sensitive malignancy status: Secondary | ICD-10-CM | POA: Diagnosis not present

## 2023-12-30 DIAGNOSIS — C61 Malignant neoplasm of prostate: Secondary | ICD-10-CM | POA: Insufficient documentation

## 2023-12-30 NOTE — Radiation Completion Notes (Signed)
 Patient Name: ELLINGTON, GREENSLADE MRN: [id number] Date of Birth: 09-01-1951 Referring Physician: Homero Luster, M.D. Date of Service: 2023-12-30 Radiation Oncologist: Bartholome Ligas, M.D. [facility] [place]                             RADIATION ONCOLOGY END OF TREATMENT NOTE     Diagnosis: C61 Malignant neoplasm of prostate Staging on 2023-04-20: Malignant neoplasm of prostate (HCC) T=cT1c, N=cN0, M=cM0 Intent: Curative     ==========DELIVERED PLANS==========  Prostate Seed Implant Date: 2023-12-06   Plan Name: Prostate Seed Implant Site: Prostate Technique: Radioactive Seed Implant I-125 Mode: Brachytherapy Dose Per Fraction: 145 Gy Prescribed Dose (Delivered / Prescribed): 145 Gy / 145 Gy Prescribed Fxs (Delivered / Prescribed): 1 / 1     ==========ON TREATMENT VISIT DATES========== 2023-12-06     ==========UPCOMING VISITS==========

## 2024-01-10 ENCOUNTER — Encounter: Payer: Self-pay | Admitting: *Deleted

## 2024-01-10 NOTE — Progress Notes (Signed)
  Radiation Oncology         [phone] ________________________________  Name: MIKHAI HANSER MRN: [id number]  Date: 12/30/2023  DOB: [birth year]  3D Planning Note   Prostate Brachytherapy Post-Implant Dosimetry  Diagnosis: 73 y.o. gentleman with Stage T1c adenocarcinoma of the prostate with Gleason Score of 3+4, and PSA of 13.7.   Narrative: On a previous date, DAWUD KOVACEVICH returned following prostate seed implantation for post implant planning. He underwent CT scan complex simulation to delineate the three-dimensional structures of the pelvis and demonstrate the radiation distribution.  Since that time, the seed localization, and complex isodose planning with dose volume histograms have now been completed.  Results:   Prostate Coverage - The dose of radiation delivered to the 90% or more of the prostate gland (D90) was 125.79% of the prescription dose. This exceeds our goal of greater than 90%. Rectal Sparing - The volume of rectal tissue receiving the prescription dose or higher was 0.0 cc. This falls under our thresholds tolerance of 1.0 cc.  Impression: The prostate seed implant appears to show adequate target coverage and appropriate rectal sparing.  Plan:  The patient will continue to follow with urology for ongoing PSA determinations. I would anticipate a high likelihood for local tumor control with minimal risk for rectal morbidity.  ________________________________  Trilby Fujisawa Lorri Rota, M.D.

## 2024-01-11 ENCOUNTER — Other Ambulatory Visit: Payer: Self-pay | Admitting: Nurse Practitioner

## 2024-01-16 ENCOUNTER — Encounter: Payer: Self-pay | Admitting: *Deleted

## 2024-02-02 ENCOUNTER — Encounter: Payer: Self-pay | Admitting: Cardiovascular Disease

## 2024-02-28 ENCOUNTER — Inpatient Hospital Stay: Attending: Adult Health | Admitting: *Deleted

## 2024-02-28 ENCOUNTER — Encounter: Payer: Self-pay | Admitting: *Deleted

## 2024-02-28 DIAGNOSIS — C61 Malignant neoplasm of prostate: Secondary | ICD-10-CM

## 2024-02-28 NOTE — Progress Notes (Signed)
 SCP reviewed and completed. Allergies and meds reviewed and updated. Vaccines reviewed and discussed. Last colonoscopy was 09/20/2023. Repeat 09/19/2024. Nutrition and exercise discussed. Pt has upcoming PSA labs and f/u with Dr. Inga Manges. A copy of prostate cancer tx summary sent to PCP.

## 2024-02-29 DIAGNOSIS — R233 Spontaneous ecchymoses: Secondary | ICD-10-CM | POA: Diagnosis not present

## 2024-03-13 ENCOUNTER — Ambulatory Visit: Attending: Cardiology | Admitting: Cardiovascular Disease

## 2024-03-13 ENCOUNTER — Encounter: Payer: Self-pay | Admitting: Cardiovascular Disease

## 2024-03-13 VITALS — BP 124/73 | HR 54 | Ht 66.0 in | Wt 189.2 lb

## 2024-03-13 DIAGNOSIS — E785 Hyperlipidemia, unspecified: Secondary | ICD-10-CM | POA: Diagnosis not present

## 2024-03-13 DIAGNOSIS — I739 Peripheral vascular disease, unspecified: Secondary | ICD-10-CM | POA: Diagnosis not present

## 2024-03-13 DIAGNOSIS — I10 Essential (primary) hypertension: Secondary | ICD-10-CM

## 2024-03-13 DIAGNOSIS — I2510 Atherosclerotic heart disease of native coronary artery without angina pectoris: Secondary | ICD-10-CM

## 2024-03-13 DIAGNOSIS — Z720 Tobacco use: Secondary | ICD-10-CM

## 2024-03-13 NOTE — Patient Instructions (Signed)
 Medication Instructions:  No changes *If you need a refill on your cardiac medications before your next appointment, please call your pharmacy*  Lab Work: None ordered If you have labs (blood work) drawn today and your tests are completely normal, you will receive your results only by: MyChart Message (if you have MyChart) OR A paper copy in the mail If you have any lab test that is abnormal or we need to change your treatment, we will call you to review the results.  Testing/Procedures: None ordered  Follow-Up: At [facility], you and your health needs are our priority.  As part of our continuing mission to provide you with exceptional heart care, our providers are all part of one team.  This team includes your primary Cardiologist (physician) and Advanced Practice Providers or APPs (Physician Assistants and Nurse Practitioners) who all work together to provide you with the care you need, when you need it.  Your next appointment:   6 month(s)  Provider:   Antionette Kirks, MD    We recommend signing up for the patient portal called "MyChart".  Sign up information is provided on this After Visit Summary.  MyChart is used to connect with patients for Virtual Visits (Telemedicine).  Patients are able to view lab/test results, encounter notes, upcoming appointments, etc.  Non-urgent messages can be sent to your provider as well.   To learn more about what you can do with MyChart, go to [email or web address].

## 2024-03-13 NOTE — Progress Notes (Signed)
 Cardiology Office Note   Date:  03/13/2024   ID:  Britton, Bera [birth year], MRN [id number]  PCP:  Chrystal [name], MD  Cardiologist:  Dr. Dann  No chief complaint on file.     History of Present Illness: Paul Tyler is a 73 y.o. male who is here today for follow-up visit regarding peripheral arterial disease. He has known history of coronary artery disease status post PCI, peripheral arterial disease, essential hypertension, prostate cancer, hyperlipidemia and chronic low back pain.  He is not diabetic and quit smoking 6 years ago.  He does use e-cigarettes.  He was seen by Dr. Harvey in 2012 for right lower extremity claudication.  His ABI at that time was 0.48 on the right and normal on the left.  His symptoms are not lifestyle limiting and thus he was treated medically. He was seen in 2023 for severe worsening of bilateral leg claudication happening with minimal exertion.   He underwent noninvasive vascular studies which showed an ABI of 0.40 on the right and 0.37 on the left with absent toe pressure.  Duplex showed severe right common iliac artery stenosis and long occlusion of the right SFA.  On the left side, the external iliac artery was occluded with longer occlusion of the SFA.  Angiography was performed in September 2023 which showed on the right side severe heavily calcified stenosis in the common iliac artery, flush occlusion of the SFA with reconstitution distally via collaterals from the profunda and three-vessel runoff below the knee.  On the left, there was flush occlusion of the common iliac artery and external neck artery with reconstitution via collaterals in the proximal common femoral artery, flush occlusion of the SFA with reconstitution distally via collaterals from the profunda and three-vessel runoff below the knee. I recommended aortobifemoral bypass and he was seen by Dr. Lanis and elected to continue with medical therapy.  He has been  doing reasonably well overall but still dealing with prostate cancer treatment.  He underwent radiation seed placement and he has issues with urine incontinence.  He denies chest pain or shortness of breath.  He reports improvement in lower extremity claudication and he is able to walk around the store now without having to use a cart.  He improved his lifestyle and lost weight and has been exercising more. He complains of easy bruising with clopidogrel .    Past Medical History:  Diagnosis Date   Adenomatous colon polyp    Anginal pain (HCC)    Anxiety    Coronary artery disease    Hyperlipidemia    Hypertension    Impotence of organic origin    Insomnia, unspecified    Prostate cancer (HCC)    Psoriasis    PVD (peripheral vascular disease) (HCC)    Recurrent low back pain    Sleep apnea    Diagnosed A very long time ago. Does not use CPAP.   SOB (shortness of breath) on exertion     Past Surgical History:  Procedure Laterality Date   ABDOMINAL AORTOGRAM W/LOWER EXTREMITY N/A 06/09/2022   Procedure: ABDOMINAL AORTOGRAM W/LOWER EXTREMITY;  Surgeon: Darron Deatrice LABOR, MD;  Location: [facility];  Service: Cardiovascular;  Laterality: N/A;   COLONOSCOPY WITH PROPOFOL  N/A 09/20/2023   Procedure: COLONOSCOPY WITH PROPOFOL ;  Surgeon: Wilhelmenia Aloha Raddle., MD;  Location: [facility];  Service: Gastroenterology;  Laterality: N/A;   CORONARY ANGIOPLASTY WITH STENT PLACEMENT  04/13/2012   MID LAD   ENDOSCOPIC  MUCOSAL RESECTION N/A 09/20/2023   Procedure: ENDOSCOPIC MUCOSAL RESECTION;  Surgeon: Wilhelmenia Aloha Raddle., MD;  Location: [facility];  Service: Gastroenterology;  Laterality: N/A;   HEMOSTASIS CLIP PLACEMENT  09/20/2023   Procedure: HEMOSTASIS CLIP PLACEMENT;  Surgeon: Wilhelmenia Aloha Raddle., MD;  Location: [facility];  Service: Gastroenterology;;   LEFT HEART CATHETERIZATION WITH CORONARY ANGIOGRAM N/A 04/13/2012   Procedure: LEFT HEART CATHETERIZATION WITH CORONARY  ANGIOGRAM;  Surgeon: Candyce GORMAN Reek, MD;  Location: [facility];  Service: Cardiovascular;  Laterality: N/A;   POLYPECTOMY  09/20/2023   Procedure: POLYPECTOMY;  Surgeon: Wilhelmenia Aloha Raddle., MD;  Location: [facility];  Service: Gastroenterology;;   PROSTATE BIOPSY     RADIOACTIVE SEED IMPLANT N/A 12/06/2023   Procedure: INSERTION, RADIATION SOURCE, PROSTATE;  Surgeon: Watt Rush, MD;  Location: [facility];  Service: Urology;  Laterality: N/A;  90 MINUTE CASE   SPACE OAR INSTILLATION N/A 12/06/2023   Procedure: INJECTION, HYDROGEL SPACER;  Surgeon: Watt Rush, MD;  Location: [facility];  Service: Urology;  Laterality: N/A;   SUBMUCOSAL LIFTING INJECTION  09/20/2023   Procedure: SUBMUCOSAL LIFTING INJECTION;  Surgeon: Wilhelmenia Aloha Raddle., MD;  Location: [facility];  Service: Gastroenterology;;   TONSILLECTOMY       Current Outpatient Medications  Medication Sig Dispense Refill   ALPRAZolam  (XANAX ) 0.25 MG tablet Take 0.125-0.25 mg by mouth 3 (three) times daily as needed for anxiety.     bismuth subsalicylate (PEPTO BISMOL) 262 MG chewable tablet Chew 524 mg by mouth as needed for indigestion or diarrhea or loose stools.     chlorthalidone (HYGROTON) 25 MG tablet Take 25 mg by mouth every morning.     Cholecalciferol (VITAMIN D) 2000 UNITS CAPS Take 2,000 Units by mouth daily.     clopidogrel  (PLAVIX ) 75 MG tablet Take 1 tablet (75 mg total) by mouth daily. 90 tablet 3   cyanocobalamin  (VITAMIN B12) 1000 MCG tablet Take 1,000 mcg by mouth daily.     docusate sodium (COLACE) 100 MG capsule Take 100 mg by mouth daily as needed for mild constipation.     losartan  (COZAAR ) 100 MG tablet Take 1 tablet by mouth once daily 90 tablet 3   nitroGLYCERIN  (NITROSTAT ) 0.4 MG SL tablet PLACE ONE TABLET UNDER THE TONGUE EVERY 5 MINUTES AS NEEDED FOR CHEST PAIN. 25 tablet 3   rosuvastatin  (CRESTOR ) 10 MG tablet Take 10 mg by mouth daily.     sildenafil (VIAGRA) 100 MG tablet Take 100 mg by mouth as  needed.     No current facility-administered medications for this visit.    Allergies:   Lipitor [atorvastatin calcium ], Zyban [bupropion hcl], Zocor [simvastatin], and Pravachol [pravastatin]    Social History:  The patient  reports that he quit smoking about 12 years ago. His smoking use included cigarettes. He started smoking about 42 years ago. He has a 45 pack-year smoking history. He has never used smokeless tobacco. He reports current alcohol use of about 3.0 - 4.0 standard drinks of alcohol per week. He reports that he does not use drugs.   Family History:  The patient's family history includes COPD in his mother; Diabetes in his father and mother; Heart disease in his father and mother; Prostate cancer in his father, maternal uncle, and maternal uncle; Stomach cancer in his maternal grandmother; Stroke in his mother.    ROS:  Please see the history of present illness.   Otherwise, review of systems are positive for none.   All other systems are reviewed  and negative.    PHYSICAL EXAM: VS:  Ht 5' 6 (1.676 m)   Wt 189 lb 3.2 oz (85.8 kg)   SpO2 98%   BMI 30.54 kg/m  , BMI Body mass index is 30.54 kg/m. GEN: Well nourished, well developed, in no acute distress  HEENT: normal  Neck: no JVD, carotid bruits, or masses Cardiac: RRR; no murmurs, rubs, or gallops,no edema  Respiratory:  clear to auscultation bilaterally, normal work of breathing GI: soft, nontender, nondistended, + BS MS: no deformity or atrophy  Skin: warm and dry, no rash Neuro:  Strength and sensation are intact Psych: euthymic mood, full affect Vascular: Femoral pulses is absent bilaterally.  Distal pulses are not palpable.  Radial pulses +2 bilaterally. No groin hematoma  EKG:  EKG is ordered today. EKG showed: Sinus bradycardia Right bundle branch block When compared with ECG of 23-Aug-2023 09:01, QRS duration has increased Non-specific change in ST segment in Anterior leads T wave inversion now  evident in Anterior leads    Recent Labs: 11/23/2023: BUN 26; Creatinine, Ser 0.96; Hemoglobin 13.5; Platelets 251; Potassium 4.4; Sodium 143    Lipid Panel No results found for: CHOL, TRIG, HDL, CHOLHDL, VLDL, LDLCALC, LDLDIRECT    Wt Readings from Last 3 Encounters:  03/13/24 189 lb 3.2 oz (85.8 kg)  12/28/23 185 lb 3.2 oz (84 kg)  12/06/23 180 lb (81.6 kg)           No data to display            ASSESSMENT AND PLAN:  1.  Peripheral arterial disease: Moderate to severe bilateral lower extremity claudication  due to multilevel disease including iliac and SFA disease.  He opted against aortobifemoral bypass. Endovascular options are possible but he does have an occlusion in the left iliac artery and calcified disease on the right common iliac artery.  This was explained to him but given improvement in symptoms, he wants to continue medical therapy.  Bilateral SFAs are occluded. I offered him vascular rehab program but he has been exercising on his own. He does complain of increased bruising with clopidogrel  but I believe the benefits outweigh the risk.  This was explained to him.  2.  Coronary artery disease involving native coronary arteries without angina: His symptoms are overall stable.  3.  Essential hypertension: Blood pressure is reasonably controlled.  4.  Hyperlipidemia: He is tolerating rosuvastatin  10 mg daily.  Most recent lipid profile showed an LDL of 66.  He has intolerance to other statins.  5.  Tobacco use: He does not smoke cigarettes continues to use an e-cigarette.  I discussed with him the importance of abstinence.    Disposition:   Follow up with me in 6 months.  Signed,  Deatrice Cage, MD  03/13/2024 7:53 AM    [facility]

## 2024-04-04 DIAGNOSIS — N403 Nodular prostate with lower urinary tract symptoms: Secondary | ICD-10-CM | POA: Diagnosis not present

## 2024-04-04 DIAGNOSIS — R351 Nocturia: Secondary | ICD-10-CM | POA: Diagnosis not present

## 2024-04-04 DIAGNOSIS — C61 Malignant neoplasm of prostate: Secondary | ICD-10-CM | POA: Diagnosis not present

## 2024-04-04 DIAGNOSIS — R350 Frequency of micturition: Secondary | ICD-10-CM | POA: Diagnosis not present

## 2024-05-01 ENCOUNTER — Other Ambulatory Visit (HOSPITAL_COMMUNITY): Payer: Self-pay

## 2024-05-01 DIAGNOSIS — F419 Anxiety disorder, unspecified: Secondary | ICD-10-CM | POA: Diagnosis not present

## 2024-05-01 DIAGNOSIS — E782 Mixed hyperlipidemia: Secondary | ICD-10-CM | POA: Diagnosis not present

## 2024-05-01 DIAGNOSIS — I739 Peripheral vascular disease, unspecified: Secondary | ICD-10-CM | POA: Diagnosis not present

## 2024-05-01 DIAGNOSIS — I10 Essential (primary) hypertension: Secondary | ICD-10-CM | POA: Diagnosis not present

## 2024-05-01 MED ORDER — ALPRAZOLAM 0.25 MG PO TABS
0.1250 mg | ORAL_TABLET | Freq: Three times a day (TID) | ORAL | 0 refills | Status: AC | PRN
  Filled 2024-05-01 – 2024-05-07 (×3): qty 30, 10d supply, fill #0

## 2024-05-04 ENCOUNTER — Other Ambulatory Visit (HOSPITAL_COMMUNITY): Payer: Self-pay

## 2024-05-04 ENCOUNTER — Other Ambulatory Visit: Payer: Self-pay

## 2024-05-04 MED ORDER — SILDENAFIL CITRATE 100 MG PO TABS
50.0000 mg | ORAL_TABLET | Freq: Every day | ORAL | 11 refills | Status: AC | PRN
  Filled 2024-05-04 – 2024-06-04 (×2): qty 30, 30d supply, fill #0

## 2024-05-04 MED ORDER — CHLORTHALIDONE 25 MG PO TABS
25.0000 mg | ORAL_TABLET | Freq: Every morning | ORAL | 3 refills | Status: AC
  Filled 2024-05-04 – 2024-05-07 (×2): qty 30, 30d supply, fill #0
  Filled 2024-06-05: qty 90, 90d supply, fill #0
  Filled 2024-06-27: qty 30, 30d supply, fill #0
  Filled 2024-06-27: qty 90, 90d supply, fill #0
  Filled 2024-07-03: qty 30, 30d supply, fill #0
  Filled 2024-07-16: qty 19, 19d supply, fill #0
  Filled 2024-07-30 – 2024-08-01 (×4): qty 30, 30d supply, fill #1
  Filled 2024-08-02 – 2024-08-11 (×4): qty 19, 19d supply, fill #1
  Filled 2024-08-20: qty 30, 30d supply, fill #1
  Filled 2024-08-30: qty 19, 19d supply, fill #1
  Filled 2024-09-11 (×2): qty 21, 21d supply, fill #2
  Filled 2024-09-13: qty 19, 19d supply, fill #2
  Filled 2024-09-25 – 2024-10-02 (×2): qty 30, 30d supply, fill #3

## 2024-05-04 MED ORDER — TADALAFIL 5 MG PO TABS
5.0000 mg | ORAL_TABLET | Freq: Every day | ORAL | 11 refills | Status: AC | PRN
  Filled 2024-05-04 – 2024-06-04 (×2): qty 30, 15d supply, fill #0

## 2024-05-04 MED ORDER — URIBEL 81.6 MG PO TABS
1.0000 | ORAL_TABLET | Freq: Three times a day (TID) | ORAL | 99 refills | Status: AC | PRN
  Filled 2024-05-04: qty 30, 10d supply, fill #0

## 2024-05-04 MED ORDER — LOSARTAN POTASSIUM 100 MG PO TABS
100.0000 mg | ORAL_TABLET | Freq: Every day | ORAL | 3 refills | Status: AC
  Filled 2024-05-04: qty 30, 30d supply, fill #0
  Filled 2024-05-07: qty 90, 90d supply, fill #0
  Filled 2024-06-05: qty 30, 30d supply, fill #0
  Filled 2024-06-27: qty 90, 90d supply, fill #0
  Filled 2024-06-27 – 2024-07-03 (×2): qty 30, 30d supply, fill #0
  Filled 2024-07-30 (×2): qty 30, 30d supply, fill #1
  Filled 2024-08-23 – 2024-09-03 (×2): qty 30, 30d supply, fill #2
  Filled 2024-09-25 – 2024-10-02 (×2): qty 30, 30d supply, fill #3

## 2024-05-04 MED ORDER — ROSUVASTATIN CALCIUM 10 MG PO TABS
10.0000 mg | ORAL_TABLET | Freq: Every day | ORAL | 3 refills | Status: DC
  Filled 2024-05-04: qty 30, 30d supply, fill #0
  Filled 2024-05-07: qty 90, 90d supply, fill #0
  Filled 2024-06-05 (×2): qty 30, 30d supply, fill #0
  Filled 2024-06-27 – 2024-07-03 (×2): qty 30, 30d supply, fill #1
  Filled 2024-07-30 (×2): qty 30, 30d supply, fill #2
  Filled 2024-08-23: qty 30, 30d supply, fill #3

## 2024-05-05 ENCOUNTER — Other Ambulatory Visit (HOSPITAL_COMMUNITY): Payer: Self-pay

## 2024-05-07 ENCOUNTER — Other Ambulatory Visit: Payer: Self-pay

## 2024-05-07 ENCOUNTER — Telehealth: Payer: Self-pay | Admitting: Pharmacist

## 2024-05-07 ENCOUNTER — Other Ambulatory Visit (HOSPITAL_COMMUNITY): Payer: Self-pay

## 2024-05-07 NOTE — Progress Notes (Signed)
   05/07/2024  Patient ID: Paul Tyler, male   DOB: 03-02-1951, 73 y.o.   MRN: [id number]  Received referral request from Dr. Chrystal for assistance transitioning patient to adherence packaging.   Med history: PRN: Alprazolam  + Sildenafil   Due 8/24: Plavix  Rosuvastatin  due 9/20 again Chlorthalidone , Losartan  due 11/5 again  Everything was set-up with [facility] for mail order and compliance packaging set-up for the future.   Message sent to PCP on 8/25: Received update from pharmacy that only the Xanax  + Clopidogrel  were filled due to inaccurate fill history in eCW (updated above now). Can add Rosuvastatin  next month but can't add the others until November (to avoid failing adherence measure metrics w/ discount card and additional cost), so adherence packaging will start in November.   Aloysius Lewis, PharmD [facility]  Phone Number: [phone]

## 2024-05-08 ENCOUNTER — Other Ambulatory Visit (HOSPITAL_COMMUNITY): Payer: Self-pay

## 2024-05-08 ENCOUNTER — Other Ambulatory Visit: Payer: Self-pay

## 2024-06-04 ENCOUNTER — Other Ambulatory Visit (HOSPITAL_COMMUNITY): Payer: Self-pay

## 2024-06-05 ENCOUNTER — Other Ambulatory Visit: Payer: Self-pay

## 2024-06-27 ENCOUNTER — Other Ambulatory Visit (HOSPITAL_COMMUNITY): Payer: Self-pay

## 2024-06-27 ENCOUNTER — Other Ambulatory Visit: Payer: Self-pay

## 2024-06-27 DIAGNOSIS — C61 Malignant neoplasm of prostate: Secondary | ICD-10-CM | POA: Diagnosis not present

## 2024-07-03 ENCOUNTER — Other Ambulatory Visit: Payer: Self-pay

## 2024-07-04 ENCOUNTER — Other Ambulatory Visit: Payer: Self-pay

## 2024-07-16 ENCOUNTER — Other Ambulatory Visit: Payer: Self-pay

## 2024-07-24 DIAGNOSIS — N3941 Urge incontinence: Secondary | ICD-10-CM | POA: Diagnosis not present

## 2024-07-24 DIAGNOSIS — N401 Enlarged prostate with lower urinary tract symptoms: Secondary | ICD-10-CM | POA: Diagnosis not present

## 2024-07-24 DIAGNOSIS — C61 Malignant neoplasm of prostate: Secondary | ICD-10-CM | POA: Diagnosis not present

## 2024-07-24 DIAGNOSIS — R350 Frequency of micturition: Secondary | ICD-10-CM | POA: Diagnosis not present

## 2024-07-27 ENCOUNTER — Other Ambulatory Visit: Payer: Self-pay

## 2024-07-30 ENCOUNTER — Other Ambulatory Visit: Payer: Self-pay

## 2024-07-31 ENCOUNTER — Other Ambulatory Visit: Payer: Self-pay

## 2024-08-01 ENCOUNTER — Other Ambulatory Visit: Payer: Self-pay

## 2024-08-02 ENCOUNTER — Other Ambulatory Visit (HOSPITAL_COMMUNITY): Payer: Self-pay

## 2024-08-02 ENCOUNTER — Other Ambulatory Visit: Payer: Self-pay

## 2024-08-08 ENCOUNTER — Other Ambulatory Visit (HOSPITAL_COMMUNITY): Payer: Self-pay

## 2024-08-08 ENCOUNTER — Other Ambulatory Visit: Payer: Self-pay

## 2024-08-10 ENCOUNTER — Other Ambulatory Visit: Payer: Self-pay

## 2024-08-11 ENCOUNTER — Other Ambulatory Visit (HOSPITAL_COMMUNITY): Payer: Self-pay

## 2024-08-13 ENCOUNTER — Other Ambulatory Visit: Payer: Self-pay

## 2024-08-15 DIAGNOSIS — Z23 Encounter for immunization: Secondary | ICD-10-CM | POA: Diagnosis not present

## 2024-08-16 ENCOUNTER — Telehealth: Payer: Self-pay | Admitting: Acute Care

## 2024-08-16 NOTE — Telephone Encounter (Signed)
 Returned call from VM.  No answer.  Phone goes straight to VM.  Left message he may have spam blocker and to please call us  back at  [phone].

## 2024-08-20 NOTE — Telephone Encounter (Signed)
 Pt returned call and LVM. Request call after 4pm. I will try him later today.

## 2024-08-21 ENCOUNTER — Other Ambulatory Visit: Payer: Self-pay

## 2024-08-23 ENCOUNTER — Other Ambulatory Visit: Payer: Self-pay

## 2024-08-24 ENCOUNTER — Other Ambulatory Visit (HOSPITAL_COMMUNITY): Payer: Self-pay

## 2024-08-24 ENCOUNTER — Telehealth: Payer: Self-pay

## 2024-08-24 ENCOUNTER — Other Ambulatory Visit: Payer: Self-pay

## 2024-08-24 MED ORDER — CHLORTHALIDONE 25 MG PO TABS
25.0000 mg | ORAL_TABLET | Freq: Every morning | ORAL | 3 refills | Status: AC
  Filled 2024-08-24: qty 90, 90d supply, fill #0

## 2024-08-24 NOTE — Telephone Encounter (Signed)
 Spoke with patient. He does not currently qualify due to prostate cancer. Seeds placed 12/2023. Next follow up with Dr. Brunetta 02/2025. Will follow up after OV for plan.

## 2024-08-24 NOTE — Telephone Encounter (Signed)
 Error

## 2024-08-26 ENCOUNTER — Other Ambulatory Visit (HOSPITAL_COMMUNITY): Payer: Self-pay

## 2024-08-26 ENCOUNTER — Other Ambulatory Visit: Payer: Self-pay

## 2024-08-28 ENCOUNTER — Other Ambulatory Visit (HOSPITAL_COMMUNITY): Payer: Self-pay

## 2024-08-29 ENCOUNTER — Other Ambulatory Visit: Payer: Self-pay

## 2024-08-30 ENCOUNTER — Other Ambulatory Visit: Payer: Self-pay

## 2024-08-30 ENCOUNTER — Other Ambulatory Visit (HOSPITAL_COMMUNITY): Payer: Self-pay

## 2024-08-31 ENCOUNTER — Other Ambulatory Visit: Payer: Self-pay

## 2024-08-31 ENCOUNTER — Other Ambulatory Visit (HOSPITAL_COMMUNITY): Payer: Self-pay

## 2024-08-31 ENCOUNTER — Other Ambulatory Visit (HOSPITAL_BASED_OUTPATIENT_CLINIC_OR_DEPARTMENT_OTHER): Payer: Self-pay

## 2024-08-31 MED ORDER — ROSUVASTATIN CALCIUM 10 MG PO TABS
10.0000 mg | ORAL_TABLET | Freq: Every day | ORAL | 3 refills | Status: DC
  Filled 2024-08-31: qty 90, 90d supply, fill #0
  Filled 2024-09-03: qty 30, 30d supply, fill #0
  Filled 2024-09-25 – 2024-10-02 (×2): qty 30, 30d supply, fill #1

## 2024-09-03 ENCOUNTER — Other Ambulatory Visit: Payer: Self-pay

## 2024-09-03 ENCOUNTER — Other Ambulatory Visit (HOSPITAL_COMMUNITY): Payer: Self-pay

## 2024-09-04 ENCOUNTER — Other Ambulatory Visit (HOSPITAL_COMMUNITY): Payer: Self-pay

## 2024-09-11 ENCOUNTER — Other Ambulatory Visit: Payer: Self-pay

## 2024-09-11 ENCOUNTER — Other Ambulatory Visit (HOSPITAL_COMMUNITY): Payer: Self-pay

## 2024-09-25 ENCOUNTER — Other Ambulatory Visit: Payer: Self-pay

## 2024-09-26 ENCOUNTER — Other Ambulatory Visit: Payer: Self-pay

## 2024-09-28 ENCOUNTER — Other Ambulatory Visit (HOSPITAL_COMMUNITY): Payer: Self-pay

## 2024-10-01 ENCOUNTER — Other Ambulatory Visit: Payer: Self-pay

## 2024-10-02 ENCOUNTER — Other Ambulatory Visit: Payer: Self-pay

## 2024-10-16 ENCOUNTER — Other Ambulatory Visit (HOSPITAL_COMMUNITY): Payer: Self-pay

## 2024-10-16 ENCOUNTER — Other Ambulatory Visit: Payer: Self-pay

## 2024-10-16 ENCOUNTER — Ambulatory Visit: Admitting: Cardiovascular Disease

## 2024-10-16 ENCOUNTER — Encounter: Payer: Self-pay | Admitting: Cardiovascular Disease

## 2024-10-16 VITALS — BP 124/72 | HR 87 | Ht 66.5 in | Wt 199.8 lb

## 2024-10-16 DIAGNOSIS — Z720 Tobacco use: Secondary | ICD-10-CM | POA: Diagnosis not present

## 2024-10-16 DIAGNOSIS — I739 Peripheral vascular disease, unspecified: Secondary | ICD-10-CM

## 2024-10-16 DIAGNOSIS — E785 Hyperlipidemia, unspecified: Secondary | ICD-10-CM | POA: Diagnosis not present

## 2024-10-16 DIAGNOSIS — I2510 Atherosclerotic heart disease of native coronary artery without angina pectoris: Secondary | ICD-10-CM | POA: Diagnosis not present

## 2024-10-16 DIAGNOSIS — I10 Essential (primary) hypertension: Secondary | ICD-10-CM

## 2024-10-16 MED ORDER — ROSUVASTATIN CALCIUM 20 MG PO TABS
20.0000 mg | ORAL_TABLET | Freq: Every day | ORAL | 3 refills | Status: AC
  Filled 2024-10-16: qty 90, 90d supply, fill #0
  Filled 2024-10-18: qty 14, 14d supply, fill #0

## 2024-10-18 ENCOUNTER — Other Ambulatory Visit: Payer: Self-pay

## 2025-01-08 ENCOUNTER — Ambulatory Visit (HOSPITAL_COMMUNITY)
